# Patient Record
Sex: Female | Born: 1935 | Race: White | Hispanic: No | Smoking: Former smoker
Health system: Southern US, Community
[De-identification: ages and names within clinical notes are randomized; demographics above are authoritative.]

## PROBLEM LIST (undated history)

## (undated) DIAGNOSIS — K219 Gastro-esophageal reflux disease without esophagitis: Secondary | ICD-10-CM

## (undated) DIAGNOSIS — E785 Hyperlipidemia, unspecified: Secondary | ICD-10-CM

## (undated) DIAGNOSIS — T7840XA Allergy, unspecified, initial encounter: Secondary | ICD-10-CM

## (undated) DIAGNOSIS — I454 Nonspecific intraventricular block: Secondary | ICD-10-CM

## (undated) DIAGNOSIS — I10 Essential (primary) hypertension: Secondary | ICD-10-CM

## (undated) DIAGNOSIS — I5022 Chronic systolic (congestive) heart failure: Secondary | ICD-10-CM

## (undated) DIAGNOSIS — I872 Venous insufficiency (chronic) (peripheral): Secondary | ICD-10-CM

## (undated) DIAGNOSIS — E0590 Thyrotoxicosis, unspecified without thyrotoxic crisis or storm: Secondary | ICD-10-CM

## (undated) DIAGNOSIS — K589 Irritable bowel syndrome without diarrhea: Secondary | ICD-10-CM

## (undated) DIAGNOSIS — I272 Pulmonary hypertension, unspecified: Secondary | ICD-10-CM

## (undated) HISTORY — DX: Pulmonary hypertension, unspecified: I27.20

## (undated) HISTORY — DX: Nonspecific intraventricular block: I45.4

## (undated) HISTORY — DX: Chronic systolic (congestive) heart failure: I50.22

## (undated) HISTORY — DX: Irritable bowel syndrome, unspecified: K58.9

## (undated) HISTORY — DX: Gastro-esophageal reflux disease without esophagitis: K21.9

## (undated) HISTORY — DX: Thyrotoxicosis, unspecified without thyrotoxic crisis or storm: E05.90

## (undated) HISTORY — DX: Allergy, unspecified, initial encounter: T78.40XA

## (undated) HISTORY — DX: Hyperlipidemia, unspecified: E78.5

## (undated) HISTORY — DX: Venous insufficiency (chronic) (peripheral): I87.2

## (undated) HISTORY — DX: Essential (primary) hypertension: I10

## (undated) HISTORY — PX: TONSILLECTOMY AND ADENOIDECTOMY: SUR1326

---
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
## 1990-01-05 HISTORY — PX: OTHER SURGICAL HISTORY: SHX169

## 2002-01-05 HISTORY — PX: BACK SURGERY: SHX140

## 2004-02-15 ENCOUNTER — Emergency Department: Payer: Self-pay | Admitting: General Practice

## 2004-05-23 ENCOUNTER — Ambulatory Visit: Payer: Self-pay | Admitting: Ophthalmology

## 2004-07-01 ENCOUNTER — Ambulatory Visit: Payer: Self-pay

## 2004-08-22 ENCOUNTER — Ambulatory Visit: Payer: Self-pay

## 2004-10-08 ENCOUNTER — Ambulatory Visit: Payer: Self-pay | Admitting: Specialist

## 2005-10-06 ENCOUNTER — Ambulatory Visit: Payer: Self-pay | Admitting: Unknown Physician Specialty

## 2006-01-05 HISTORY — PX: ABLATION SAPHENOUS VEIN W/ RFA: SUR11

## 2006-06-07 ENCOUNTER — Ambulatory Visit: Payer: Self-pay | Admitting: Specialist

## 2006-06-23 ENCOUNTER — Ambulatory Visit: Payer: Self-pay | Admitting: Specialist

## 2007-06-28 ENCOUNTER — Encounter: Payer: Self-pay | Admitting: Family Medicine

## 2008-02-23 ENCOUNTER — Encounter: Payer: Self-pay | Admitting: Internal Medicine

## 2008-04-05 ENCOUNTER — Ambulatory Visit: Payer: Self-pay | Admitting: Unknown Physician Specialty

## 2008-04-05 ENCOUNTER — Encounter: Payer: Self-pay | Admitting: Internal Medicine

## 2008-04-19 ENCOUNTER — Encounter: Payer: Self-pay | Admitting: Internal Medicine

## 2008-07-05 ENCOUNTER — Encounter: Payer: Self-pay | Admitting: Internal Medicine

## 2008-08-31 ENCOUNTER — Encounter: Payer: Self-pay | Admitting: Internal Medicine

## 2008-12-07 ENCOUNTER — Encounter: Payer: Self-pay | Admitting: Internal Medicine

## 2009-03-27 ENCOUNTER — Encounter: Payer: Self-pay | Admitting: Internal Medicine

## 2009-04-05 ENCOUNTER — Encounter: Payer: Self-pay | Admitting: Internal Medicine

## 2009-05-08 ENCOUNTER — Emergency Department: Payer: Self-pay | Admitting: Emergency Medicine

## 2009-05-13 ENCOUNTER — Encounter: Payer: Self-pay | Admitting: Internal Medicine

## 2009-05-15 ENCOUNTER — Ambulatory Visit: Payer: Self-pay | Admitting: Unknown Physician Specialty

## 2009-05-15 ENCOUNTER — Encounter: Payer: Self-pay | Admitting: Internal Medicine

## 2009-05-21 ENCOUNTER — Encounter: Payer: Self-pay | Admitting: Internal Medicine

## 2009-05-24 ENCOUNTER — Ambulatory Visit: Payer: Self-pay | Admitting: Family Medicine

## 2009-06-21 ENCOUNTER — Ambulatory Visit: Payer: Self-pay | Admitting: Internal Medicine

## 2009-06-21 DIAGNOSIS — I10 Essential (primary) hypertension: Secondary | ICD-10-CM

## 2009-06-21 DIAGNOSIS — I872 Venous insufficiency (chronic) (peripheral): Secondary | ICD-10-CM | POA: Insufficient documentation

## 2009-06-21 DIAGNOSIS — K219 Gastro-esophageal reflux disease without esophagitis: Secondary | ICD-10-CM

## 2009-06-21 DIAGNOSIS — M810 Age-related osteoporosis without current pathological fracture: Secondary | ICD-10-CM | POA: Insufficient documentation

## 2009-06-21 DIAGNOSIS — E039 Hypothyroidism, unspecified: Secondary | ICD-10-CM | POA: Insufficient documentation

## 2009-06-21 DIAGNOSIS — K5909 Other constipation: Secondary | ICD-10-CM

## 2009-06-21 DIAGNOSIS — E785 Hyperlipidemia, unspecified: Secondary | ICD-10-CM

## 2009-06-21 DIAGNOSIS — M1990 Unspecified osteoarthritis, unspecified site: Secondary | ICD-10-CM | POA: Insufficient documentation

## 2009-06-21 DIAGNOSIS — J309 Allergic rhinitis, unspecified: Secondary | ICD-10-CM | POA: Insufficient documentation

## 2009-07-05 ENCOUNTER — Ambulatory Visit: Payer: Self-pay | Admitting: Family Medicine

## 2009-07-05 DIAGNOSIS — B351 Tinea unguium: Secondary | ICD-10-CM

## 2009-07-24 ENCOUNTER — Telehealth (INDEPENDENT_AMBULATORY_CARE_PROVIDER_SITE_OTHER): Payer: Self-pay | Admitting: *Deleted

## 2009-07-25 ENCOUNTER — Ambulatory Visit: Payer: Self-pay | Admitting: Internal Medicine

## 2009-07-25 DIAGNOSIS — R32 Unspecified urinary incontinence: Secondary | ICD-10-CM

## 2009-07-25 LAB — CONVERTED CEMR LAB
Bilirubin Urine: NEGATIVE
Blood in Urine, dipstick: NEGATIVE
Glucose, Urine, Semiquant: NEGATIVE
Ketones, urine, test strip: NEGATIVE
Nitrite: NEGATIVE
Protein, U semiquant: NEGATIVE
Specific Gravity, Urine: 1.01
Urobilinogen, UA: 0.2
pH: 7.5

## 2009-07-26 ENCOUNTER — Telehealth: Payer: Self-pay | Admitting: Internal Medicine

## 2009-07-31 ENCOUNTER — Encounter: Payer: Self-pay | Admitting: Internal Medicine

## 2009-08-02 ENCOUNTER — Telehealth: Payer: Self-pay | Admitting: Internal Medicine

## 2009-08-27 ENCOUNTER — Emergency Department: Payer: Self-pay | Admitting: Emergency Medicine

## 2009-09-02 ENCOUNTER — Ambulatory Visit: Payer: Self-pay | Admitting: Internal Medicine

## 2009-09-02 DIAGNOSIS — S60919A Unspecified superficial injury of unspecified wrist, initial encounter: Secondary | ICD-10-CM

## 2009-09-02 DIAGNOSIS — S50909A Unspecified superficial injury of unspecified elbow, initial encounter: Secondary | ICD-10-CM | POA: Insufficient documentation

## 2009-09-02 DIAGNOSIS — S50919A Unspecified superficial injury of unspecified forearm, initial encounter: Secondary | ICD-10-CM

## 2009-12-09 ENCOUNTER — Ambulatory Visit: Payer: Self-pay | Admitting: Internal Medicine

## 2009-12-10 ENCOUNTER — Telehealth: Payer: Self-pay | Admitting: Internal Medicine

## 2009-12-10 LAB — CONVERTED CEMR LAB
ALT: 27 units/L (ref 0–35)
AST: 45 units/L — ABNORMAL HIGH (ref 0–37)
Albumin: 3.7 g/dL (ref 3.5–5.2)
Alkaline Phosphatase: 59 units/L (ref 39–117)
BUN: 26 mg/dL — ABNORMAL HIGH (ref 6–23)
Basophils Absolute: 0 10*3/uL (ref 0.0–0.1)
Basophils Relative: 0.1 % (ref 0.0–3.0)
Bilirubin, Direct: 0.1 mg/dL (ref 0.0–0.3)
CO2: 33 meq/L — ABNORMAL HIGH (ref 19–32)
Calcium: 9.3 mg/dL (ref 8.4–10.5)
Chloride: 94 meq/L — ABNORMAL LOW (ref 96–112)
Creatinine, Ser: 0.8 mg/dL (ref 0.4–1.2)
Eosinophils Absolute: 0 10*3/uL (ref 0.0–0.7)
Eosinophils Relative: 0.5 % (ref 0.0–5.0)
Free T4: 1.37 ng/dL (ref 0.60–1.60)
GFR calc non Af Amer: 74.42 mL/min (ref >60.00)
Glucose, Bld: 82 mg/dL (ref 70–99)
HCT: 42.4 % (ref 36.0–46.0)
Hemoglobin: 14.7 g/dL (ref 12.0–15.0)
Lymphocytes Relative: 16.5 % (ref 12.0–46.0)
Lymphs Abs: 1 10*3/uL (ref 0.7–4.0)
MCHC: 34.6 g/dL (ref 30.0–36.0)
MCV: 91.1 fL (ref 78.0–100.0)
Monocytes Absolute: 0.5 10*3/uL (ref 0.1–1.0)
Monocytes Relative: 7.3 % (ref 3.0–12.0)
Neutro Abs: 4.6 10*3/uL (ref 1.4–7.7)
Neutrophils Relative %: 75.6 % (ref 43.0–77.0)
Phosphorus: 4 mg/dL (ref 2.3–4.6)
Platelets: 140 10*3/uL — ABNORMAL LOW (ref 150.0–400.0)
Potassium: 3.2 meq/L — ABNORMAL LOW (ref 3.5–5.1)
RBC: 4.65 M/uL (ref 3.87–5.11)
RDW: 13.2 % (ref 11.5–14.6)
Sodium: 136 meq/L (ref 135–145)
TSH: 1.78 microintl units/mL (ref 0.35–5.50)
Total Bilirubin: 0.8 mg/dL (ref 0.3–1.2)
Total Protein: 6.3 g/dL (ref 6.0–8.3)
WBC: 6.2 10*3/uL (ref 4.5–10.5)

## 2009-12-20 ENCOUNTER — Ambulatory Visit: Payer: Self-pay | Admitting: Internal Medicine

## 2009-12-23 LAB — CONVERTED CEMR LAB: Potassium: 3.6 meq/L (ref 3.5–5.1)

## 2010-01-21 ENCOUNTER — Telehealth: Payer: Self-pay | Admitting: Internal Medicine

## 2010-01-21 ENCOUNTER — Ambulatory Visit
Admission: RE | Admit: 2010-01-21 | Discharge: 2010-01-21 | Payer: Self-pay | Source: Home / Self Care | Attending: Internal Medicine | Admitting: Internal Medicine

## 2010-01-21 ENCOUNTER — Encounter: Payer: Self-pay | Admitting: Internal Medicine

## 2010-01-21 ENCOUNTER — Other Ambulatory Visit: Payer: Self-pay | Admitting: Internal Medicine

## 2010-01-21 DIAGNOSIS — G459 Transient cerebral ischemic attack, unspecified: Secondary | ICD-10-CM | POA: Insufficient documentation

## 2010-01-21 LAB — CBC WITH DIFFERENTIAL/PLATELET
Basophils Absolute: 0 10*3/uL (ref 0.0–0.1)
Basophils Relative: 0 % (ref 0.0–3.0)
Eosinophils Absolute: 0 10*3/uL (ref 0.0–0.7)
Eosinophils Relative: 0.3 % (ref 0.0–5.0)
HCT: 37.5 % (ref 36.0–46.0)
Hemoglobin: 12.9 g/dL (ref 12.0–15.0)
Lymphocytes Relative: 7.4 % — ABNORMAL LOW (ref 12.0–46.0)
Lymphs Abs: 0.6 10*3/uL — ABNORMAL LOW (ref 0.7–4.0)
MCHC: 34.3 g/dL (ref 30.0–36.0)
MCV: 90.6 fl (ref 78.0–100.0)
Monocytes Absolute: 0.5 10*3/uL (ref 0.1–1.0)
Monocytes Relative: 5.9 % (ref 3.0–12.0)
Neutro Abs: 6.9 10*3/uL (ref 1.4–7.7)
Neutrophils Relative %: 86.4 % — ABNORMAL HIGH (ref 43.0–77.0)
Platelets: 196 10*3/uL (ref 150.0–400.0)
RBC: 4.14 Mil/uL (ref 3.87–5.11)
RDW: 13.1 % (ref 11.5–14.6)
WBC: 8 10*3/uL (ref 4.5–10.5)

## 2010-01-21 LAB — RENAL FUNCTION PANEL
Albumin: 3.2 g/dL — ABNORMAL LOW (ref 3.5–5.2)
BUN: 24 mg/dL — ABNORMAL HIGH (ref 6–23)
CO2: 30 mEq/L (ref 19–32)
Calcium: 8.8 mg/dL (ref 8.4–10.5)
Chloride: 98 mEq/L (ref 96–112)
Creatinine, Ser: 0.5 mg/dL (ref 0.4–1.2)
GFR: 112.04 mL/min (ref >60.00)
Glucose, Bld: 97 mg/dL (ref 70–99)
Phosphorus: 3.9 mg/dL (ref 2.3–4.6)
Potassium: 3.9 mEq/L (ref 3.5–5.1)
Sodium: 137 mEq/L (ref 135–145)

## 2010-01-21 LAB — HEPATIC FUNCTION PANEL
ALT: 20 U/L (ref 0–35)
AST: 33 U/L (ref 0–37)
Albumin: 3.2 g/dL — ABNORMAL LOW (ref 3.5–5.2)
Alkaline Phosphatase: 55 U/L (ref 39–117)
Bilirubin, Direct: 0.1 mg/dL (ref 0.0–0.3)
Total Bilirubin: 0.7 mg/dL (ref 0.3–1.2)
Total Protein: 6.2 g/dL (ref 6.0–8.3)

## 2010-01-21 LAB — TSH: TSH: 1.31 u[IU]/mL (ref 0.35–5.50)

## 2010-02-04 NOTE — Progress Notes (Signed)
Summary: desipramine  Phone Note Call from Patient Call back at Home Phone [phone]   Caller: Patient Call For: Cindee Salt MD Summary of Call: Patient was taking OXYBUTYNIN CHLORIDE 5 MG TABS and it was causing her to have blurred vision.  She then started on DESIPRAMINE HCL 10 MG TABS and she says that is also causing her to have blurred vision. She wants to know what she should do now. Initial call taken by: Melody Comas,  August 02, 2009 4:45 PM  Follow-up for Phone Call        I am unaware of any overactive bladder medication that does not cause blurred vision as a possible side effect.  PLease let her know that Dr. Alphonsus Sias will return in one week and I will send this to him for his input.   Ruthe Mannan MD  August 04, 2009 2:16 PM  Please let her know that she will not be able to take medicine for her bladder Follow-up by: Cindee Salt MD,  August 04, 2009 4:07 PM  Additional Follow-up for Phone Call Additional follow up Details #1::        Spoke with patient and notified her that no meds were available for her at this point that didn't have that as a side effect. She was fine with that and said she would rather deal with the bladder issue than the blurred vision. Additional Follow-up by: [name] CMA,  August 05, 2009 9:06 AM

## 2010-02-04 NOTE — Assessment & Plan Note (Signed)
Summary: 3 month follow up/rbh   Vital Signs:  Patient profile:   77 year old female Weight:      129 pounds Temp:     98.3 degrees F oral BP sitting:   148 / 80  (left arm) Cuff size:   regular  Vitals Entered By: Mervin Hack CMA Duncan Dull) (September 02, 2009 12:29 PM) CC: 3 month follow-up   History of Present Illness: Unable to tolerate oxybutynin or desipramine Affected her vision Still using depends---but has to use 2-3 each night no problems during the day  Has skin tear on left forearm Stacey Blankenship has been treating this  Legs still have some swelling No sig pain lately Tries to keep them elevated and uses support stockings  No chest pain No SOB  Allergies: No Known Drug Allergies  Past History:  Past medical, surgical, family and social histories (including risk factors) reviewed for relevance to current acute and chronic problems.  Past Medical History: Reviewed history from 06/21/2009 and no changes required. Allergic rhinitis GERD Hyperlipidemia Hypertension Hypothyroidism Osteoporosis IBS/chronic constipation Right bundle branch block Chronic venous insufficiency Osteoarthritis  Past Surgical History: Reviewed history from 06/21/2009 and no changes required. Tonsillectomy and adenoidectomy 1920 Pneumonia  1992 Back surgery 2004 Vein ablation  ~2008  Family History: Reviewed history from 06/21/2009 and no changes required. Mom died of old age at 37 Dad died @67  of cerebral thrombosis 1 brother died of multiorgan failure 1 brother living  Social History: Reviewed history from 06/21/2009 and no changes required. 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 Daughter [name] is her health care POA. Has DNR and reviewed this order. Would not want tube feeds if cogitively unaware.  Review of Systems       sleeps  fine despite the urinary issues--just has to change attends appetite is good weight is stable  Physical Exam  General:  alert and normal appearance.   Neck:  supple, no masses, no thyromegaly, no carotid bruits, and no cervical lymphadenopathy.   Lungs:  normal respiratory effort, no intercostal retractions, no accessory muscle use, and normal breath sounds.   Heart:  normal rate, regular rhythm, no murmur, and no gallop.   Abdomen:  soft and non-tender.   Extremities:  no sig edema with hose on Skin:  skin tear with devitalized skin on left forearm no infection Psych:  normally interactive, good eye contact, not anxious appearing, and not depressed appearing.     Impression & Recommendations:  Problem # 1:  INCONTINENCE/ENURESIS, NOS (ICD-788.30) Assessment Unchanged unable to take meds will continue with the depends  Problem # 2:  HYPERTENSION (ICD-401.9) Assessment: Unchanged okay with the meds  Her updated medication list for this problem includes:    Metoprolol Tartrate 50 Mg Tabs (Metoprolol tartrate) .Marland Kitchen... Take 1 by mouth once daily    Losartan Potassium-hctz 100-25 Mg Tabs (Losartan potassium-hctz) .Marland Kitchen... Take 1 by mouth once daily  BP today: 148/80 Prior BP: 126/74 (07/25/2009)  Problem # 3:  OTH&UNS SUP INJR ELB FORARM&WRST W/O MENTION INF (ICD-913.8) Assessment: New  devitalized skin debrided and covered with silvadene and sterile dressing  Orders: Ace Wraps 3-5 in/yard  ([id number]) EMR Misc Charge Code [facility])  Problem # 4:  HYPOTHYROIDISM (ICD-244.9) Assessment: Unchanged okay on meds will do labs next time  Her updated medication list for this problem includes:    Levoxyl 75 Mcg Tabs (Levothyroxine sodium) .Marland KitchenMarland KitchenMarland KitchenMarland Kitchen  Take 1 by mouth once daily  Complete Medication List: 1)  Metoprolol Tartrate 50 Mg Tabs (Metoprolol tartrate) .... Take 1 by mouth once daily 2)  Levoxyl 75 Mcg Tabs (Levothyroxine sodium) .... Take 1 by mouth once daily 3)  Losartan  Potassium-hctz 100-25 Mg Tabs (Losartan potassium-hctz) .... Take 1 by mouth once daily 4)  Miralax Powd (Polyethylene glycol 3350) .Marland Kitchen.. 1 capful daily with water to prevent constipation as needed 5)  Aspirin 81 Mg Tbec (Aspirin) .Marland Kitchen.. 1 tab every other day 6)  Thera-m Tabs (Multiple vitamins-minerals) .... Take 1 by mouth once daily 7)  Preservision/lutein Caps (Multiple vitamins-minerals) .... Take 1 by mouth once daily 8)  Vitamin D 1000 Unit Tabs (Cholecalciferol) .... Take 1 by mouth once daily 9)  Calcium-vitamin D3 600-200 Mg-unit Tabs (Calcium carbonate-vitamin d) .... Take 1 by mouth once daily 10)  Omega-3 350 Mg Caps (Omega-3 fatty acids) .... Take 1 by mouth once daily 11)  Vitamin B-12 1000 Mcg Tabs (Cyanocobalamin) .... Take 1 by mouth once daily 12)  Lutein 20 Mg Tabs (Lutein) .... Take 1 by mouth once daily 13)  B Complex 100 Tr Cr-tabs (B complex vitamins) .... Take 1 by mouth once daily 14)  Silver Sulfadiazine 1 % Crea (Silver sulfadiazine) .... Apply to skin tear daily till well scabbed  Patient Instructions: 1)  Please schedule a follow-up appointment in 4 months .  Prescriptions: SILVER SULFADIAZINE 1 % CREA (SILVER SULFADIAZINE) apply to skin tear daily till well scabbed  #30gm x 1   Entered and Authorized by:   Stacey Blankenship   Signed by:   Stacey Blankenship on 09/02/2009   Method used:   Electronically to        [facility]* (retail)       [address]       [place], [place]  [address]       Ph: [phone]       Fax: [phone]   RxID:   [id number]   Current Allergies (reviewed today): No known allergies

## 2010-02-04 NOTE — Letter (Signed)
Summary: [facility]  [facility]   Imported By: Lanelle Bal 07/12/2009 13:03:48  _____________________________________________________________________  External Attachment:    Type:   Image     Comment:   External Document

## 2010-02-04 NOTE — Assessment & Plan Note (Signed)
Summary: NEW PT TO [facility]/[facility]   Vital Signs:  Patient profile:   77 year old female Height:      62 inches Weight:      127 pounds BMI:     23.31 Temp:     97.6 degrees F oral Pulse rate:   64 / minute Pulse rhythm:   regular BP sitting:   112 / 72  (left arm) Cuff size:   regular  Vitals Entered By: Mervin Hack CMA Duncan Dull) (June 21, 2009 3:13 PM) CC: new patient to establish care   History of Present Illness: [place] resident Establishing with me  History of recurrent abdominal bloating Constipation which she feels is controlled with apples, etc still bloated though further eval sounds like constipation--will have her restart the miralax  had spell of confusion with resp infection in ER Work up negative but called TIA and started on aggrenox doesn't really sound like true neurologic event though  HTN for many years has been variable  allergies--okay on medicines  Some arthritic pain does okay with glucosamine and chondriotin  Chronic venous insuff not satisfied with vein procedure just wears support hose   Preventive Screening-Counseling & Management  Alcohol-Tobacco     Smoking Status: quit  Allergies (verified): No Known Drug Allergies  Past History:  Past Medical History: Allergic rhinitis GERD Hyperlipidemia Hypertension Hypothyroidism Osteoporosis IBS/chronic constipation Right bundle branch block Chronic venous insufficiency Osteoarthritis  Past Surgical History: Tonsillectomy and adenoidectomy 1920 Pneumonia  1992 Back surgery 06-16-2002 Vein ablation  06/16/2006  Family History: Mom died of old age at 21 Dad died @67  of cerebral thrombosis 1 brother died of multiorgan failure 1 brother living  Social History: Retired--primary Engineer, site, 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 Daughter [name] is her health care  POA. Has DNR and reviewed this order. Would not want tube feeds if cogitively unaware.Smoking Status:  quit  Review of Systems General:  Denies sleep disorder; weight down slightly wears seat belt. Eyes:  Denies double vision and vision loss-1 eye. ENT:  Complains of decreased hearing; wears hearing aides own teeth --regular with dentist. CV:  Denies chest pain or discomfort, difficulty breathing at night, difficulty breathing while lying down, fainting, lightheadness, palpitations, and shortness of breath with exertion. Resp:  Denies cough and shortness of breath. GI:  Complains of constipation and gas; denies bloody stools, dark tarry stools, and indigestion. GU:  Complains of incontinence and urinary frequency; denies dysuria; urge incontinence--wears pad. MS:  Complains of joint pain; denies joint swelling; does well with osteo biflex. Derm:  Denies lesion(s) and rash. Neuro:  Complains of tingling; denies numbness and weakness; occ hand tingling. Psych:  Denies anxiety and depression. Allergy:  Complains of seasonal allergies and sneezing.  Physical Exam  General:  alert and normal appearance.   Eyes:  pupils equal, pupils round, and pupils reactive to light.   Mouth:  no erythema and no exudates.   Neck:  supple, no masses, no thyromegaly, no carotid bruits, and no cervical lymphadenopathy.   Lungs:  normal respiratory effort and normal breath sounds.   Heart:  normal rate, regular rhythm, no murmur, and no gallop.   Abdomen:  soft and non-tender.   Msk:  no joint tenderness and no joint swelling.   Pulses:  faint in feet Extremities:  no sig edema Skin:  no rashes and no suspicious lesions.   Mild venous  stasis changes Axillary Nodes:  No palpable lymphadenopathy Psych:  normally interactive, good eye contact, not anxious appearing, and not depressed appearing.     Impression & Recommendations:  Problem # 1:  HYPERTENSION (ICD-401.9) Assessment Unchanged good  control changes may be able to drop a med next time doesn't sound like TIA----will change aggrenox back to asa  Her updated medication list for this problem includes:    Metoprolol Tartrate 50 Mg Tabs (Metoprolol tartrate) .Marland Kitchen... Take 1 by mouth once daily    Losartan Potassium-hctz 100-25 Mg Tabs (Losartan potassium-hctz) .Marland Kitchen... Take 1 by mouth once daily  BP today: 112/72  Problem # 2:  OTHER CONSTIPATION (ICD-564.09) Assessment: Comment Only asked her to restart regular miralax  The following medications were removed from the medication list:    Stool Softener 100 Mg Caps (Docusate sodium) .Marland Kitchen... Take 1 by mouth once daily Her updated medication list for this problem includes:    Miralax Powd (Polyethylene glycol 3350) .Marland Kitchen... 1 capful daily with water to prevent constipation  Problem # 3:  HYPOTHYROIDISM (ICD-244.9) Assessment: Comment Only clinically euthyroid labs next time reviewed labs from Dr Lin Givens  Her updated medication list for this problem includes:    Levoxyl 75 Mcg Tabs (Levothyroxine sodium) .Marland Kitchen... Take 1 by mouth once daily  Problem # 4:  VENOUS INSUFFICIENCY (ICD-459.81) Assessment: Unchanged mild okay with stockings now  Problem # 5:  OSTEOPOROSIS (ICD-733.00) Assessment: Unchanged on calcium and vitamin D  Her updated medication list for this problem includes:    Vitamin D 1000 Unit Tabs (Cholecalciferol) .Marland Kitchen... Take 1 by mouth once daily    Calcium-vitamin D3 600-200 Mg-unit Tabs (Calcium carbonate-vitamin d) .Marland Kitchen... Take 1 by mouth once daily  Problem # 6:  OSTEOARTHRITIS (ICD-715.90) Assessment: Comment Only okay with just osteo bi flex  Complete Medication List: 1)  Metoprolol Tartrate 50 Mg Tabs (Metoprolol tartrate) .... Take 1 by mouth once daily 2)  Levoxyl 75 Mcg Tabs (Levothyroxine sodium) .... Take 1 by mouth once daily 3)  Losartan Potassium-hctz 100-25 Mg Tabs (Losartan potassium-hctz) .... Take 1 by mouth once daily 4)  Miralax Powd  (Polyethylene glycol 3350) .Marland Kitchen.. 1 capful daily with water to prevent constipation 5)  Aspirin 81 Mg Tbec (Aspirin) .Marland Kitchen.. 1 tab daily 6)  Thera-m Tabs (Multiple vitamins-minerals) .... Take 1 by mouth once daily 7)  Preservision/lutein Caps (Multiple vitamins-minerals) .... Take 1 by mouth once daily 8)  Vitamin D 1000 Unit Tabs (Cholecalciferol) .... Take 1 by mouth once daily 9)  Calcium-vitamin D3 600-200 Mg-unit Tabs (Calcium carbonate-vitamin d) .... Take 1 by mouth once daily 10)  Omega-3 350 Mg Caps (Omega-3 fatty acids) .... Take 1 by mouth once daily 11)  Osteo Bi-flex Regular Strength 250-200 Mg Tabs (Glucosamine-chondroitin) .... Take 1 by mouth once daily 12)  Vitamin B-12 1000 Mcg Tabs (Cyanocobalamin) .... Take 1 by mouth once daily 13)  Lutein 20 Mg Tabs (Lutein) .... Take 1 by mouth once daily 14)  B Complex 100 Tr Cr-tabs (B complex vitamins) .... Take 1 by mouth once daily  Patient Instructions: 1)  Please schedule a follow-up appointment in 3 months .   Current Allergies (reviewed today): No known allergies    Immunization History:  Tetanus/Td Immunization History:    Tetanus/Td:  Historical (08/06/2008)  Pneumovax Immunization History:    Pneumovax:  Historical (10/09/2002)

## 2010-02-04 NOTE — Assessment & Plan Note (Signed)
Summary: TROUBLE URINATING/[facility]   Vital Signs:  Patient profile:   77 year old female Weight:      129.13 pounds Temp:     98.3 degrees F oral Pulse rate:   76 / minute Pulse rhythm:   regular BP sitting:   126 / 74  (right arm) Cuff size:   regular  Vitals Entered By: [name] CMA (July 25, 2009 4:00 PM) CC: frequent/trouble urination   History of Present Illness: having a number of concerns  Has been voiding a lot trouble with control---has to use pads Esp bothersome at night--has cover over bed but it gets soaked  Hurts if she tries to hold urine No sig dysuria Pad during day is reasonably successful  No hematuria  Did use med from urologist but had terrible dry eyes from it  Allergies: No Known Drug Allergies  Past History:  Past medical, surgical, family and social histories (including risk factors) reviewed for relevance to current acute and chronic problems.  Past Medical History: Reviewed history from 06/21/2009 and no changes required. Allergic rhinitis GERD Hyperlipidemia Hypertension Hypothyroidism Osteoporosis IBS/chronic constipation Right bundle branch block Chronic venous insufficiency Osteoarthritis  Past Surgical History: Reviewed history from 06/21/2009 and no changes required. Tonsillectomy and adenoidectomy 1920 Pneumonia  1992 Back surgery 2004 Vein ablation  ~2008  Family History: Reviewed history from 06/21/2009 and no changes required. Mom died of old age at 52 Dad died @67  of cerebral thrombosis 1 brother died of multiorgan failure 1 brother living  Social History: Reviewed history from 06/21/2009 and no changes required. 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 Daughter [name] is her health care POA. Has DNR and reviewed this order. Would not want tube feeds if cogitively  unaware.  Review of Systems       no fever no nausea or vomiting  Physical Exam  General:  alert and normal appearance.   Abdomen:  soft and non-tender.   SLight distention   Impression & Recommendations:  Problem # 1:  INCONTINENCE/ENURESIS, NOS (ICD-788.30) Assessment Deteriorated  really troubling her at night no signs of infection  will try low dose oxybutynin if side effects, or doesn't work, can dry desipramine   Orders: UA Dipstick w/o Micro (manual) ([address])  Complete Medication List: 1)  Metoprolol Tartrate 50 Mg Tabs (Metoprolol tartrate) .... Take 1 by mouth once daily 2)  Levoxyl 75 Mcg Tabs (Levothyroxine sodium) .... Take 1 by mouth once daily 3)  Losartan Potassium-hctz 100-25 Mg Tabs (Losartan potassium-hctz) .... Take 1 by mouth once daily 4)  Miralax Powd (Polyethylene glycol 3350) .Marland Kitchen.. 1 capful daily with water to prevent constipation 5)  Aspirin 81 Mg Tbec (Aspirin) .Marland Kitchen.. 1 tab daily 6)  Thera-m Tabs (Multiple vitamins-minerals) .... Take 1 by mouth once daily 7)  Preservision/lutein Caps (Multiple vitamins-minerals) .... Take 1 by mouth once daily 8)  Vitamin D 1000 Unit Tabs (Cholecalciferol) .... Take 1 by mouth once daily 9)  Calcium-vitamin D3 600-200 Mg-unit Tabs (Calcium carbonate-vitamin d) .... Take 1 by mouth once daily 10)  Omega-3 350 Mg Caps (Omega-3 fatty acids) .... Take 1 by mouth once daily 11)  Osteo Bi-flex Regular Strength 250-200 Mg Tabs (Glucosamine-chondroitin) .... Take 1 by mouth once daily 12)  Vitamin B-12 1000 Mcg Tabs (Cyanocobalamin) .... Take 1 by mouth once daily 13)  Lutein 20 Mg Tabs (Lutein) .... Take 1 by mouth once  daily 14)  B Complex 100 Tr Cr-tabs (B complex vitamins) .... Take 1 by mouth once daily 15)  Oxybutynin Chloride 5 Mg Tabs (Oxybutynin chloride) .... 1/2-1 tab by mouth at bedtime to help urinary symptoms  Patient Instructions: 1)  Please keep September 28th appt 2)  Please call if the oxybutynin doesn't  work or you have to stop it due to side effects Prescriptions: OXYBUTYNIN CHLORIDE 5 MG TABS (OXYBUTYNIN CHLORIDE) 1/2-1 tab by mouth at bedtime to help urinary symptoms  #30 x 3   Entered and Authorized by:   Stacey Blankenship   Signed by:   Stacey Blankenship on 07/25/2009   Method used:   Electronically to        [facility]* (retail)       [address]       [place], [place]  [address]       Ph: [phone]       Fax: [phone]   RxID:   [id number]   Current Allergies (reviewed today): No known allergies  Laboratory Results   Urine Tests  Date/Time Received: July 25, 2009 4:57 PM  Date/Time Reported: July 25, 2009 4:57 PM   Routine Urinalysis   Color: yellow Appearance: Clear Glucose: negative   (Normal Range: Negative) Bilirubin: negative   (Normal Range: Negative) Ketone: negative   (Normal Range: Negative) Spec. Gravity: 1.010   (Normal Range: 1.003-1.035) Blood: negative   (Normal Range: Negative) pH: 7.5   (Normal Range: 5.0-8.0) Protein: negative   (Normal Range: Negative) Urobilinogen: 0.2   (Normal Range: 0-1) Nitrite: negative   (Normal Range: Negative) Leukocyte Esterace: trace   (Normal Range: Negative)

## 2010-02-04 NOTE — Letter (Signed)
Summary: Office Notes Dated 02-23-08 thru 05-30-09/[facility]  Office Notes Dated 02-23-08 thru 05-30-09/[facility]   Imported By: Lanelle Bal 06/26/2009 09:36:39  _____________________________________________________________________  External Attachment:    Type:   Image     Comment:   External Document

## 2010-02-04 NOTE — Miscellaneous (Signed)
Summary: Do Not Resuscitate Order  Do Not Resuscitate Order   Imported By: Beau Fanny 06/21/2009 16:45:48  _____________________________________________________________________  External Attachment:    Type:   Image     Comment:   External Document

## 2010-02-04 NOTE — Letter (Signed)
Summary: Patient Questionnaire  Patient Questionnaire   Imported By: Beau Fanny 06/24/2009 09:25:44  _____________________________________________________________________  External Attachment:    Type:   Image     Comment:   External Document

## 2010-02-04 NOTE — Progress Notes (Signed)
Summary: oxybutynin   Phone Note Call from Patient Call back at Home Phone [phone]   Caller: Patient Call For: Cindee Salt MD Summary of Call: Patient says that she called her pharmacist today and they said that she tried  the OXYBUTYNIN CHLORIDE 5 MG TABS back in november and it caused her to have blurred vision. She is asking if she could try something different. Uses [facility].  Initial call taken by: Melody Comas,  July 26, 2009 1:36 PM  Follow-up for Phone Call        will try the desipramine instead Let her know new Rx sent  Spoke with patient and advised her of the new medication. [name] CMA  July 26, 2009 2:33 PM  Follow-up by: Cindee Salt MD,  July 26, 2009 1:51 PM    New/Updated Medications: DESIPRAMINE HCL 10 MG TABS (DESIPRAMINE HCL) take 1-2 tabs at bedtime to reduce urination Prescriptions: DESIPRAMINE HCL 10 MG TABS (DESIPRAMINE HCL) take 1-2 tabs at bedtime to reduce urination  #60 x 1   Entered and Authorized by:   Cindee Salt MD   Signed by:   Cindee Salt MD on 07/26/2009   Method used:   Electronically to        [facility]* (retail)       [address]       [place], [place]  [address]       Ph: [phone]       Fax: [phone]   RxID:   [id number]

## 2010-02-04 NOTE — Letter (Signed)
Summary: [facility]  [facility]   Imported By: Lanelle Bal 08/09/2009 13:41:58  _____________________________________________________________________  External Attachment:    Type:   Image     Comment:   External Document  Appended Document: [facility] Please call patient Let her know the circulation in the legs is normal Very reassuring  please give [facility] my correct address  Appended Document: [facility] Patient notified and address updated with [facility].  Appended Document: [facility] She is still complaining of leg/foot pain and redness/discoloration to legs and feet. She requests "that you think about what could be causing this" and let her know at her next appointment.  Appended Document: [facility] Noted

## 2010-02-04 NOTE — Assessment & Plan Note (Signed)
Summary: PROBLEM WITH FOOT/lb   Vital Signs:  Patient profile:   77 year old female Height:      62 inches Weight:      129 pounds BMI:     23.68 Temp:     98.6 degrees F oral Pulse rate:   68 / minute Pulse rhythm:   regular BP sitting:   110 / 88  (right arm) Cuff size:   regular  Vitals Entered By: Linde Gillis CMA Duncan Dull) (July 05, 2009 2:12 PM) CC: problem with foot   History of Present Illness: 77 yo female new to me here to discuss discoloration of her right foot.  Has a history of venous insufficiency and chronic discoloration bilaterally from venous insufficiency.  She wears support hose.  Over past few days, she has noticed that the top of her right foot and toes are turning dark purple and flaky.  Does not hurt or itch.  Her nails are also very thick and itchy. Wants to know what she can do about that.    Current Medications (verified): 1)  Metoprolol Tartrate 50 Mg Tabs (Metoprolol Tartrate) .... Take 1 By Mouth Once Daily 2)  Levoxyl 75 Mcg Tabs (Levothyroxine Sodium) .... Take 1 By Mouth Once Daily 3)  Losartan Potassium-Hctz 100-25 Mg Tabs (Losartan Potassium-Hctz) .... Take 1 By Mouth Once Daily 4)  Miralax  Powd (Polyethylene Glycol 3350) .Marland Kitchen.. 1 Capful Daily With Water To Prevent Constipation 5)  Aspirin 81 Mg Tbec (Aspirin) .Marland Kitchen.. 1 Tab Daily 6)  Thera-M  Tabs (Multiple Vitamins-Minerals) .... Take 1 By Mouth Once Daily 7)  Preservision/lutein  Caps (Multiple Vitamins-Minerals) .... Take 1 By Mouth Once Daily 8)  Vitamin D 1000 Unit Tabs (Cholecalciferol) .... Take 1 By Mouth Once Daily 9)  Calcium-Vitamin D3 600-200 Mg-Unit Tabs (Calcium Carbonate-Vitamin D) .... Take 1 By Mouth Once Daily 10)  Omega-3 350 Mg Caps (Omega-3 Fatty Acids) .... Take 1 By Mouth Once Daily 11)  Osteo Bi-Flex Regular Strength 250-200 Mg Tabs (Glucosamine-Chondroitin) .... Take 1 By Mouth Once Daily 12)  Vitamin B-12 1000 Mcg Tabs (Cyanocobalamin) .... Take 1 By Mouth Once  Daily 13)  Lutein 20 Mg Tabs (Lutein) .... Take 1 By Mouth Once Daily 14)  B Complex 100 Tr  Cr-Tabs (B Complex Vitamins) .... Take 1 By Mouth Once Daily  Allergies (verified): No Known Drug Allergies  Past History:  Past Medical History: Last updated: 2009/07/21 Allergic rhinitis GERD Hyperlipidemia Hypertension Hypothyroidism Osteoporosis IBS/chronic constipation Right bundle branch block Chronic venous insufficiency Osteoarthritis  Past Surgical History: Last updated: July 21, 2009 Tonsillectomy and adenoidectomy 1920 Pneumonia  1992 Back surgery 2004 Vein ablation  ~2008  Family History: Last updated: July 21, 2009 Mom died of old age at 44 Dad died @67  of cerebral thrombosis 1 brother died of multiorgan failure 1 brother living  Social History: Last updated: July 21, 2009 Retired--primary school Runner, broadcasting/film/video, 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 Daughter [name] is her health care POA. Has DNR and reviewed this order. Would not want tube feeds if cogitively unaware.  Risk Factors: Smoking Status: quit (07-21-2009)  Review of Systems      See HPI CV:  Denies chest pain or discomfort. Resp:  Denies shortness of breath.  Physical Exam  General:  alert and normal appearance.   Msk:  no joint tenderness and no joint swelling.   Extremities:  no sig edema bilateral discoloration in both lower  legs and feet from chronic venous insufficiency. Top of right foot and toes is darker in color.   Pulses are diminished bilaterally, right> left. Nails are thicked and flaky. Psych:  normally interactive, good eye contact, not anxious appearing, and not depressed appearing.     Impression & Recommendations:  Problem # 1:  VENOUS INSUFFICIENCY (ICD-459.81) Assessment Deteriorated Given acute change in right foot, will send for ABIs and doppler of right lower  extremity. Pt is very active and I do not want her to loose mobility. 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 Oral treatment is usually necessary but she would like to try topical lamisil first as there are side effects associated with oral antifugals. Will continue to follow at her next appt.  Complete Medication List: 1)  Metoprolol Tartrate 50 Mg Tabs (Metoprolol tartrate) .... Take 1 by mouth once daily 2)  Levoxyl 75 Mcg Tabs (Levothyroxine sodium) .... Take 1 by mouth once daily 3)  Losartan Potassium-hctz 100-25 Mg Tabs (Losartan potassium-hctz) .... Take 1 by mouth once daily 4)  Miralax Powd (Polyethylene glycol 3350) .Marland Kitchen.. 1 capful daily with water to prevent constipation 5)  Aspirin 81 Mg Tbec (Aspirin) .Marland Kitchen.. 1 tab daily 6)  Thera-m Tabs (Multiple vitamins-minerals) .... Take 1 by mouth once daily 7)  Preservision/lutein Caps (Multiple vitamins-minerals) .... Take 1 by mouth once daily 8)  Vitamin D 1000 Unit Tabs (Cholecalciferol) .... Take 1 by mouth once daily 9)  Calcium-vitamin D3 600-200 Mg-unit Tabs (Calcium carbonate-vitamin d) .... Take 1 by mouth once daily 10)  Omega-3 350 Mg Caps (Omega-3 fatty acids) .... Take 1 by mouth once daily 11)  Osteo Bi-flex Regular Strength 250-200 Mg Tabs (Glucosamine-chondroitin) .... Take 1 by mouth once daily 12)  Vitamin B-12 1000 Mcg Tabs (Cyanocobalamin) .... Take 1 by mouth once daily 13)  Lutein 20 Mg Tabs (Lutein) .... Take 1 by mouth once daily 14)  B Complex 100 Tr Cr-tabs (B complex vitamins) .... Take 1 by mouth once daily  Patient Instructions: 1)  Great to meet you, Ms. [name]. 2)  Please stop by to see Shirlee Limerick on your way out set up the studies on your foot. 3)  Try Lamisil over the counter, follow instrucitons on the bottle.  Current Allergies (reviewed today): No known allergies

## 2010-02-04 NOTE — Assessment & Plan Note (Signed)
Summary: ROA FOR 4 MONTH FOLLOW-UP/JRR   Vital Signs:  Patient profile:   77 year old female Weight:      130 pounds Temp:     97.6 degrees F oral BP sitting:   158 / 80  (left arm) Cuff size:   regular  Vitals Entered By: Mervin Hack CMA [facility] Dull) (December 09, 2009 10:35 AM) CC: 4 month follow-up   History of Present Illness: Got lost getting here--went too far  Late and now frazzled Busy the last 2 nights with the "[place] [employer] at [facility] fairly well in general Having some edema in feet today and recently some pain in lower calves Scaly skin in calves  Notes dry eyes No sig pain  Still uses depends at night occ daytime incontinence Has resigned herself to this  No chest pain--but occ gets a "check", like she can't swallow well No SOB No sig heartburn Did have esophageal dilation in past  Allergies: No Known Drug Allergies  Past History:  Past medical, surgical, family and social histories (including risk factors) reviewed for relevance to current acute and chronic problems.  Past Medical History: Reviewed history from 06/21/2009 and no changes required. Allergic rhinitis GERD Hyperlipidemia Hypertension Hypothyroidism Osteoporosis IBS/chronic constipation Right bundle branch block Chronic venous insufficiency Osteoarthritis  Past Surgical History: Reviewed history from 06/21/2009 and no changes required. Tonsillectomy and adenoidectomy 1920 Pneumonia  1992 Back surgery 2004 Vein ablation  ~2008  Family History: Reviewed history from 06/21/2009 and no changes required. Mom died of old age at 31 Dad died @67  of cerebral thrombosis 1 brother died of multiorgan failure 1 brother living  Social History: Reviewed history from 06/21/2009 and no changes required. 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 Daughter [name] is her health care POA. Has DNR and reviewed this order. Would not want tube feeds if cogitively unaware.  Review of Systems       appetite is okay weight is stable Sleeps okay---using 2 attends at night and able to go without changing better Has some right low back pain when "tired"--lateral lumbar.  Uses occ aleve and this helps, 2 is effective. Only occ needs  Physical Exam  General:  alert and normal appearance.   Neck:  supple, no masses, no thyromegaly, and no cervical lymphadenopathy.   Lungs:  normal respiratory effort, no intercostal retractions, and no accessory muscle use.  SLight coarse left basilar crackles No wheezes Heart:  normal rate, regular rhythm, and no gallop.   Soft systolic murmur loudest at base Extremities:  1+ edema to knee with slight tenderness Skin:  no suspicious lesions and no ulcerations.   Mycotic toenails without inflammation Psych:  normally interactive, good eye contact, not anxious appearing, and not depressed appearing.     Impression & Recommendations:  Problem # 1:  INCONTINENCE/ENURESIS, NOS (ICD-788.30) Assessment Comment Only stable will not try any other meds  Problem # 2:  VENOUS INSUFFICIENCY (ICD-459.81) Assessment: Deteriorated worse today after being up more this weekend no changes---she will keep them up till they are better  Problem # 3:  HYPOTHYROIDISM (ICD-244.9) Assessment: Unchanged  due for labs  Her updated medication list for this problem includes:    Levoxyl 75 Mcg Tabs (Levothyroxine sodium) .Marland Kitchen... Take 1 by mouth once daily  Orders: TLB-TSH (Thyroid Stimulating Hormone) (84443-TSH) TLB-T4 (Thyrox), Free [phone])  Problem # 4:  HYPERTENSION (ICD-401.9) Assessment: Unchanged  reasonable control for age with no orthostasis due for labs  Her updated medication list for this problem includes:    Metoprolol Tartrate 50 Mg Tabs (Metoprolol tartrate) .Marland Kitchen... Take 1 by mouth once  daily    Losartan Potassium-hctz 100-25 Mg Tabs (Losartan potassium-hctz) .Marland Kitchen... Take 1 by mouth once daily  BP today: 158/80 Prior BP: 148/80 (09/02/2009)  Orders: Venipuncture (37106) TLB-Renal Function Panel (80069-RENAL) TLB-CBC Platelet - w/Differential (85025-CBCD) TLB-Hepatic/Liver Function Pnl (80076-HEPATIC)  Complete Medication List: 1)  Metoprolol Tartrate 50 Mg Tabs (Metoprolol tartrate) .... Take 1 by mouth once daily 2)  Levoxyl 75 Mcg Tabs (Levothyroxine sodium) .... Take 1 by mouth once daily 3)  Losartan Potassium-hctz 100-25 Mg Tabs (Losartan potassium-hctz) .... Take 1 by mouth once daily 4)  Miralax Powd (Polyethylene glycol 3350) .Marland Kitchen.. 1 capful daily with water to prevent constipation as needed 5)  Aspirin 81 Mg Tbec (Aspirin) .Marland Kitchen.. 1 tab every other day 6)  Thera-m Tabs (Multiple vitamins-minerals) .... Take 1 by mouth once daily 7)  Preservision/lutein Caps (Multiple vitamins-minerals) .... Take 1 by mouth once daily 8)  Vitamin D 1000 Unit Tabs (Cholecalciferol) .... Take 1 by mouth once daily 9)  Calcium-vitamin D3 600-200 Mg-unit Tabs (Calcium carbonate-vitamin d) .... Take 1 by mouth once daily 10)  Omega-3 350 Mg Caps (Omega-3 fatty acids) .... Take 1 by mouth once daily 11)  Vitamin B-12 1000 Mcg Tabs (Cyanocobalamin) .... Take 1 by mouth once daily 12)  Lutein 20 Mg Tabs (Lutein) .... Take 1 by mouth once daily 13)  B Complex 100 Tr Cr-tabs (B complex vitamins) .... Take 1 by mouth once daily  Patient Instructions: 1)  Please schedule a follow-up appointment in 4 months .    Orders Added: 1)  Est. Patient Level IV [26948] 2)  Venipuncture [36415] 3)  TLB-Renal Function Panel [80069-RENAL] 4)  TLB-CBC Platelet - w/Differential [85025-CBCD] 5)  TLB-Hepatic/Liver Function Pnl [80076-HEPATIC] 6)  TLB-TSH (Thyroid Stimulating Hormone) [84443-TSH] 7)  TLB-T4 (Thyrox), Free [54627-OJ5K]    Current Allergies (reviewed today): No known allergies

## 2010-02-04 NOTE — Progress Notes (Signed)
Summary: wants appt.   Phone Note Call from Patient   Caller: Angelica Chessman [phone] Call For: Cindee Salt MD Summary of Call: Angelica Chessman called to let you know that patient  has been having trouble urinating. She has been having to get up alot at night to go to the bathroom. Patient has tried vesicare and other meds related to vesicare and she can't take these due to the drying affect. She is wanting an appt as soon as possible. Your schedule is pretty far out, can she be added on any sooner. Please adviise.  Initial call taken by: Melody Comas,  July 24, 2009 1:55 PM  Follow-up for Phone Call        Okay to add on at 4:30PM tomorrow Follow-up by: Cindee Salt MD,  July 24, 2009 2:02 PM  Additional Follow-up for Phone Call Additional follow up Details #1::        I left a message on [name]'s voice mail to let her know I was adding pt. on to schedule tomorrow @ 4:30.  I asked Angelica Chessman to call me back to confirm. Additional Follow-up by: Beau Fanny,  July 24, 2009 2:12 PM

## 2010-02-04 NOTE — Progress Notes (Signed)
Summary: pt requests phone call  Phone Note Call from Patient Call back at Home Phone [phone]   Caller: Patient Call For: Cindee Salt MD Summary of Call: Pt would like for you to call her at your convenience.  She would like to tell you about the food she and her friends collected for donation. Initial call taken by: Lowella Petties CMA, AAMA,  December 10, 2009 2:55 PM  Follow-up for Phone Call        message left I had heard that 18 tons of food were collected Follow-up by: Cindee Salt MD,  December 11, 2009 2:01 PM

## 2010-02-06 NOTE — Progress Notes (Signed)
Summary: had episode this morning  Phone Note Call from Patient Call back at Home Phone [phone]   Caller: Daughter  [name]  [phone] Summary of Call: Pt's daughter states pt has had some problems this morning.  Had nausea and felt weak.  BP was checked at 108/53.  She had been off of metoprolol for one month and took one last night.  Daughter is asking if that could be what caused episode this morning.  She is feeling better now, ate a banana and improved.  Had breakfast and is now back to normal.  Please advise. Initial call taken by: Lowella Petties CMA, AAMA,  January 21, 2010 8:46 AM  Follow-up for Phone Call        please add on at 12:45Pm today if she can come in Follow-up by: Cindee Salt MD,  January 21, 2010 10:06 AM  Additional Follow-up for Phone Call Additional follow up Details #1::        appt scheduled today at 12:45 Additional Follow-up by: Mervin Hack CMA Duncan Dull),  January 21, 2010 10:17 AM

## 2010-02-06 NOTE — Letter (Signed)
Summary: E-mail from [facility]  E-mail from [facility]   Imported By: Beau Fanny 01/21/2010 14:45:15  _____________________________________________________________________  External Attachment:    Type:   Image     Comment:   External Document

## 2010-02-06 NOTE — Assessment & Plan Note (Signed)
Summary: 12:45 per Dr.Dyan Labarbera/ds   Vital Signs:  Patient profile:   77 year old female Weight:      128 pounds Temp:     98.3 degrees F oral Pulse rate:   74 / minute Pulse rhythm:   regular BP sitting:   148 / 64  (left arm) Cuff size:   regular  Vitals Entered By: Mervin Hack CMA Duncan Dull) (January 21, 2010 1:06 PM) CC: blood pressure   History of Present Illness: Hadn't been taking her metoprolol for about a month Ran out and was visiting daughter then  took it last night for the first time in  ~3 weeks Had gone shopping with daughter in [place] ---had a late dinner Had spell this AM --emergency call activated Called to daughter in next room Doubled over the sink Couldn't get her to move Not speaking---?some slurring Took some effort to get her sitting  Then asked for banana and felt better total time 3-5 minutes where she was not right Back to normal within about 10 minutes (and by the time the RN got there) BP 108/56 then  Had trouble remembering aspects of her day (per daughter) this is before the spell today   Allergies: No Known Drug Allergies  Past History:  Past medical, surgical, family and social histories (including risk factors) reviewed for relevance to current acute and chronic problems.  Past Medical History: Reviewed history from 06/21/2009 and no changes required. Allergic rhinitis GERD Hyperlipidemia Hypertension Hypothyroidism Osteoporosis IBS/chronic constipation Right bundle branch block Chronic venous insufficiency Osteoarthritis  Past Surgical History: Reviewed history from 06/21/2009 and no changes required. Tonsillectomy and adenoidectomy 1920 Pneumonia  1992 Back surgery 2004 Vein ablation  ~2008  Family History: Reviewed history from 06/21/2009 and no changes required. Mom died of old age at 8 Dad died @67  of cerebral thrombosis 1 brother died of multiorgan failure 1 brother living  Social History: Reviewed  history from 06/21/2009 and no changes required. 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 Daughter [name] is her health care POA. Has DNR and reviewed this order. Would not want tube feeds if cogitively unaware.  Review of Systems       Didn't sleep well last night---still troubled by the incontinence Did not prepare for sleep properly last night-- lights on, knocked over some cosmetics, etc  Physical Exam  General:  alert and normal appearance.   Eyes:  pupils equal, pupils round, pupils reactive to light, and no nystagmus.   Mouth:  no erythema and no exudates.   Lungs:  normal respiratory effort, no intercostal retractions, and no accessory muscle use.  Slight basilar crackles Heart:  normal rate, regular rhythm, and no gallop.   Neurologic:  alert & oriented X3, cranial nerves II-XII intact, strength normal in all extremities, finger-to-nose normal, and Romberg negative.  Slight instabilty in gait but not ataxic Psych:  normally interactive, good eye contact, not anxious appearing, and not depressed appearing.     Impression & Recommendations:  Problem # 1:  TIA (ICD-435.9) Assessment New  sounds like it may have been a hypotensive episode had restarted metoprolol short acting last night neuro exam reassuring now  P: will stop the metoprolol     will have [name] RN monitor BP  Her updated medication list for this problem includes:    Aspirin 81 Mg Tbec (Aspirin) .Marland Kitchen... 1 tab every other day  Orders: Venipuncture (84132) TLB-Renal Function Panel (80069-RENAL) TLB-CBC Platelet - w/Differential (85025-CBCD) TLB-Hepatic/Liver Function Pnl (80076-HEPATIC) TLB-TSH (Thyroid Stimulating Hormone) (84443-TSH)  Complete Medication List: 1)  Levoxyl 75 Mcg Tabs (Levothyroxine sodium) .... Take 1 by mouth once daily 2)  Losartan Potassium-hctz  100-25 Mg Tabs (Losartan potassium-hctz) .... Take 1 by mouth once daily 3)  Miralax Powd (Polyethylene glycol 3350) .Marland Kitchen.. 1 capful daily with water to prevent constipation as needed 4)  Aspirin 81 Mg Tbec (Aspirin) .Marland Kitchen.. 1 tab every other day 5)  Thera-m Tabs (Multiple vitamins-minerals) .... Take 1 by mouth once daily 6)  Preservision/lutein Caps (Multiple vitamins-minerals) .... Take 1 by mouth once daily 7)  Vitamin D 1000 Unit Tabs (Cholecalciferol) .... Take 1 by mouth once daily 8)  Calcium-vitamin D3 600-200 Mg-unit Tabs (Calcium carbonate-vitamin d) .... Take 1 by mouth once daily 9)  Omega-3 350 Mg Caps (Omega-3 fatty acids) .... Take 1 by mouth once daily 10)  Vitamin B-12 1000 Mcg Tabs (Cyanocobalamin) .... Take 1 by mouth once daily 11)  Lutein 20 Mg Tabs (Lutein) .... Take 1 by mouth once daily 12)  B Complex 100 Tr Cr-tabs (B complex vitamins) .... Take 1 by mouth once daily  Patient Instructions: 1)  Stop the metoprolol 2)  Please schedule a follow-up appointment in 1 month.    Orders Added: 1)  Venipuncture [36415] 2)  TLB-Renal Function Panel [80069-RENAL] 3)  TLB-CBC Platelet - w/Differential [85025-CBCD] 4)  TLB-Hepatic/Liver Function Pnl [80076-HEPATIC] 5)  TLB-TSH (Thyroid Stimulating Hormone) [44010-UVO]    Current Allergies (reviewed today): No known allergies

## 2010-02-18 ENCOUNTER — Encounter: Payer: Self-pay | Admitting: Internal Medicine

## 2010-02-21 ENCOUNTER — Ambulatory Visit (INDEPENDENT_AMBULATORY_CARE_PROVIDER_SITE_OTHER): Payer: Medicare Other | Admitting: Internal Medicine

## 2010-02-21 ENCOUNTER — Encounter: Payer: Self-pay | Admitting: Internal Medicine

## 2010-02-21 DIAGNOSIS — I872 Venous insufficiency (chronic) (peripheral): Secondary | ICD-10-CM

## 2010-02-21 DIAGNOSIS — I10 Essential (primary) hypertension: Secondary | ICD-10-CM

## 2010-02-21 DIAGNOSIS — G459 Transient cerebral ischemic attack, unspecified: Secondary | ICD-10-CM

## 2010-02-26 NOTE — Assessment & Plan Note (Signed)
Summary: 1 m roa cyd   Vital Signs:  Patient profile:   77 year old female Weight:      130 pounds Temp:     97.8 degrees F oral Pulse rate:   69 / minute Pulse rhythm:   regular BP sitting:   144 / 92  (right arm) Cuff size:   regular  Vitals Entered By: Mervin Hack CMA Duncan Dull) (February 21, 2010 11:20 AM) CC: follow-up   History of Present Illness: She is in with daughter again She feels well No further spells has been keeping track of her blood pressure [name] RN has been checking----  138/92, 185/89, then 162/92  No headaches no dizziness No chest pain No palpitiations  Has had some pain in ankles  some skin sloughing  Allergies: No Known Drug Allergies  Past History:  Past medical, surgical, family and social histories (including risk factors) reviewed for relevance to current acute and chronic problems.  Past Medical History: Reviewed history from 06/21/2009 and no changes required. Allergic rhinitis GERD Hyperlipidemia Hypertension Hypothyroidism Osteoporosis IBS/chronic constipation Right bundle branch block Chronic venous insufficiency Osteoarthritis  Past Surgical History: Reviewed history from 06/21/2009 and no changes required. Tonsillectomy and adenoidectomy 1920 Pneumonia  1992 Back surgery 2004 Vein ablation  ~2008  Family History: Reviewed history from 06/21/2009 and no changes required. Mom died of old age at 82 Dad died @67  of cerebral thrombosis 1 brother died of multiorgan failure 1 brother living  Social History: Reviewed history from 06/21/2009 and no changes required. 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 Daughter [name] is her health care POA. Has DNR and reviewed this order. Would not want tube feeds if cogitively unaware.  Review of Systems       Appetite is  great weight up 2# sleeps okay despite nocturia Stays active---lots of activities  Physical Exam  General:  alert and normal appearance.   Neck:  supple, no masses, and no cervical lymphadenopathy.   Lungs:  normal respiratory effort, no intercostal retractions, and no accessory muscle use.   Slight bibasilar crackles Heart:  normal rate, regular rhythm, no murmur, and no gallop.   Extremities:  1+ pittting edema on right  no sig edema on left Skin:  venous stasis changes on right with redness and skin sloughing laterally just above ankle no ulcers Psych:  normally interactive, good eye contact, not anxious appearing, and not depressed appearing.     Impression & Recommendations:  Problem # 1:  TIA (ICD-435.9) Assessment Improved no more spells probably a hypotensive spell --not neurologic  Her updated medication list for this problem includes:    Aspirin 81 Mg Tbec (Aspirin) .Marland Kitchen... 1 tab every other day  Problem # 2:  HYPERTENSION (ICD-401.9) Assessment: Comment Only BP is okay off the metoprolol no changes for now  Her updated medication list for this problem includes:    Losartan Potassium-hctz 100-25 Mg Tabs (Losartan potassium-hctz) .Marland Kitchen... Take 1 by mouth once daily  BP today: 144/92 Prior BP: 148/64 (01/21/2010)  Labs Reviewed: K+: 3.9 (01/21/2010) Creat: : 0.5 (01/21/2010)     Problem # 3:  VENOUS INSUFFICIENCY (ICD-459.81) Assessment: Deteriorated having trouble with right leg despite support hose not able to tolerate fluid pills discussed avoiding salt (she does) and elevation skin moisturizers  Complete Medication List: 1)  Levoxyl 75 Mcg Tabs (Levothyroxine sodium) .... Take 1 by mouth once daily  2)  Losartan Potassium-hctz 100-25 Mg Tabs (Losartan potassium-hctz) .... Take 1 by mouth once daily 3)  Miralax Powd (Polyethylene glycol 3350) .Marland Kitchen.. 1 capful daily with water to prevent constipation as needed 4)  Aspirin 81 Mg Tbec (Aspirin) .Marland Kitchen.. 1 tab every  other day 5)  Thera-m Tabs (Multiple vitamins-minerals) .... Take 1 by mouth once daily 6)  Preservision/lutein Caps (Multiple vitamins-minerals) .... Take 1 by mouth once daily 7)  Vitamin D 1000 Unit Tabs (Cholecalciferol) .... Take 1 by mouth once daily 8)  Calcium-vitamin D3 600-200 Mg-unit Tabs (Calcium carbonate-vitamin d) .... Take 1 by mouth once daily 9)  Omega-3 350 Mg Caps (Omega-3 fatty acids) .... Take 1 by mouth once daily 10)  Vitamin B-12 1000 Mcg Tabs (Cyanocobalamin) .... Take 1 by mouth once daily 11)  Lutein 20 Mg Tabs (Lutein) .... Take 1 by mouth once daily 12)  B Complex 100 Tr Cr-tabs (B complex vitamins) .... Take 1 by mouth once daily  Patient Instructions: 1)  Please schedule a follow-up appointment in 4 months .    Orders Added: 1)  Est. Patient Level IV [40102]    Current Allergies (reviewed today): No known allergies

## 2010-03-06 ENCOUNTER — Ambulatory Visit: Payer: BC Managed Care – PPO | Admitting: Family Medicine

## 2010-03-10 ENCOUNTER — Ambulatory Visit (INDEPENDENT_AMBULATORY_CARE_PROVIDER_SITE_OTHER): Payer: Medicare Other | Admitting: Family Medicine

## 2010-03-10 ENCOUNTER — Encounter: Payer: Self-pay | Admitting: Family Medicine

## 2010-03-10 DIAGNOSIS — N390 Urinary tract infection, site not specified: Secondary | ICD-10-CM

## 2010-03-10 LAB — CONVERTED CEMR LAB
Ketones, urine, test strip: NEGATIVE
Nitrite: POSITIVE
Specific Gravity, Urine: 1.02
Urobilinogen, UA: 0.2

## 2010-03-11 ENCOUNTER — Encounter: Payer: Self-pay | Admitting: Family Medicine

## 2010-03-18 NOTE — Assessment & Plan Note (Signed)
Summary: UTI / LFW   Vital Signs:  Patient profile:   77 year old female Height:      62 inches Weight:      130 pounds BMI:     23.86 Temp:     97.7 degrees F oral Pulse rate:   84 / minute Pulse rhythm:   regular BP sitting:   102 / 60  (left arm) Cuff size:   regular  Vitals Entered By: Delilah Shan CMA [place] Goh Dull) (March 10, 2010 4:17 PM)  Serial Vital Signs/Assessments:  Time      Position  BP       Pulse  Resp  Temp     By                     120/70                         Crawford Givens MD  CC: ? UTI   History of Present Illness: dysuria: yes, some relief with AZO/cranberry juice duration of symptoms: since Tuesday abdominal pain: no fevers: no back pain: no vomiting: no other concerns: no  Allergies: No Known Drug Allergies  Review of Systems       See HPI.  Otherwise negative.    Physical Exam  General:  GEN: nad, alert and oriented HEENT: mucous membranes moist NECK: supple CV: rrr.  PULM: ctab, no inc wob ABD: soft, +bs, suprapubic area not tender EXT: no edema, in compression stockings SKIN: no acute rash BACK: no CVA pain    Impression & Recommendations:  Problem # 1:  UTI (ICD-599.0) start septra, check ucx and follow up as needed.  nontoxic.  she agrees.   Orders: Prescription Created Electronically [id number]) Specimen Handling (60454) T-Culture, Urine ([id number])  Her updated medication list for this problem includes:    Septra Ds 800-160 Mg Tabs (Sulfamethoxazole-trimethoprim) .Marland Kitchen... 1 by mouth two times a day x3 days.  Complete Medication List: 1)  Levoxyl 75 Mcg Tabs (Levothyroxine sodium) .... Take 1 by mouth once daily 2)  Losartan Potassium-hctz 100-25 Mg Tabs (Losartan potassium-hctz) .... Take 1 by mouth once daily 3)  Miralax Powd (Polyethylene glycol 3350) .Marland Kitchen.. 1 capful daily with water to prevent constipation as needed 4)  Aspirin 81 Mg Tbec (Aspirin) .Marland Kitchen.. 1 tab every other day 5)  Thera-m Tabs (Multiple vitamins-minerals)  .... Take 1 by mouth once daily 6)  Preservision/lutein Caps (Multiple vitamins-minerals) .... Take 1 by mouth once daily 7)  Vitamin D 1000 Unit Tabs (Cholecalciferol) .... Take 1 by mouth once daily 8)  Calcium-vitamin D3 600-200 Mg-unit Tabs (Calcium carbonate-vitamin d) .... Take 1 by mouth once daily 9)  Omega-3 350 Mg Caps (Omega-3 fatty acids) .... Take 1 by mouth once daily 10)  Vitamin B-12 1000 Mcg Tabs (Cyanocobalamin) .... Take 1 by mouth once daily 11)  Lutein 20 Mg Tabs (Lutein) .... Take 1 by mouth once daily 12)  B Complex 100 Tr Cr-tabs (B complex vitamins) .... Take 1 by mouth once daily 13)  X-viate 40 % Crea (Urea) .... Apply to nail at bedtime 14)  Aquaphor Advanced Therapy Oint (Emollient) .... Apply to affected areas as needed 15)  Genteal 0.3 % Soln (Hypromellose) .... As needed 16)  Triamcinolone Acetonide 0.1 % Crea (Triamcinolone acetonide) .... Apply to lower legs two times a day until clear 17)  Septra Ds 800-160 Mg Tabs (Sulfamethoxazole-trimethoprim) .Marland Kitchen.. 1 by mouth two times a day x3  days.  Other Orders: UA Dipstick w/o Micro (manual) (60454)  Patient Instructions: 1)  Drink plenty of fluids.  Cranberry juice is especially recommended in addition to large amounts of water. Avoid caffeine & carbonated drinks, they tend to irritate the bladder, Return in 3-5 days if you're not better: sooner if you're feeling worse.  We'll contact you with your lab report. Take care.  Prescriptions: SEPTRA DS 800-160 MG TABS (SULFAMETHOXAZOLE-TRIMETHOPRIM) 1 by mouth two times a day x3 days.  #6 x 0   Entered and Authorized by:   Crawford Givens MD   Signed by:   Crawford Givens MD on 03/10/2010   Method used:   Electronically to        [facility]* (retail)       [address]       [place], [place]  [address]       Ph: [phone]       Fax: [phone]   RxID:   [id number]    Orders Added: 1)  UA Dipstick w/o Micro (manual) [81002] 2)   Prescription Created Electronically [[id number]] 3)  Est. Patient Level III [27253] 4)  Specimen Handling [99000] 5)  T-Culture, Urine [[id number]]    Current Allergies (reviewed today): No known allergies   Laboratory Results   Urine Tests  Date/Time Received: March 10, 2010 4:42 PM   Routine Urinalysis   Color: yellow Appearance: Cloudy Glucose: negative   (Normal Range: Negative) Bilirubin: negative   (Normal Range: Negative) Ketone: negative   (Normal Range: Negative) Spec. Gravity: 1.020   (Normal Range: 1.003-1.035) Blood: small   (Normal Range: Negative) pH: 6.0   (Normal Range: 5.0-8.0) Protein: trace   (Normal Range: Negative) Urobilinogen: 0.2   (Normal Range: 0-1) Nitrite: positive   (Normal Range: Negative) Leukocyte Esterace: trace   (Normal Range: Negative)

## 2010-04-09 ENCOUNTER — Encounter: Payer: Self-pay | Admitting: Internal Medicine

## 2010-04-09 ENCOUNTER — Ambulatory Visit (INDEPENDENT_AMBULATORY_CARE_PROVIDER_SITE_OTHER): Payer: Medicare Other | Admitting: Internal Medicine

## 2010-04-09 VITALS — BP 158/80 | HR 67 | Temp 98.4°F | Ht 62.0 in | Wt 130.0 lb

## 2010-04-09 DIAGNOSIS — M1990 Unspecified osteoarthritis, unspecified site: Secondary | ICD-10-CM

## 2010-04-09 DIAGNOSIS — I10 Essential (primary) hypertension: Secondary | ICD-10-CM

## 2010-04-09 DIAGNOSIS — K5909 Other constipation: Secondary | ICD-10-CM

## 2010-04-09 DIAGNOSIS — I872 Venous insufficiency (chronic) (peripheral): Secondary | ICD-10-CM

## 2010-04-09 DIAGNOSIS — K219 Gastro-esophageal reflux disease without esophagitis: Secondary | ICD-10-CM

## 2010-04-09 NOTE — Progress Notes (Signed)
  Subjective:    Patient ID: Stacey Blankenship, female    DOB: 05/10/1915, 77 y.o.   MRN: [id number]  HPI DOing well Able to drive here herself  Has gained a little weight Clothes not fitting as well  Ongoing urinary incontinence Uses attends regularly---occ needs 2 at night despite watching fluid intake Urine infection seems to have resolved DRinks cranberry juice regularly--discussed trying cranberry tablets if she prefers  Bowels have been okay Uses the miralax--only needs 2 teaspoons daily Heartubrn quiet --not needing meds  No chest pain Gets DOE if overdoes it--no change in tolerance No dizziness or syncope No headache Minor edema still  No major arthritis pain occ trouble with right hip  Past Medical History  Diagnosis Date  . Allergy   . GERD (gastroesophageal reflux disease)   . Hyperlipidemia   . Hypertension   . Hyperthyroidism   . Osteoporosis   . IBS (irritable bowel syndrome)   . Bundle branch block   . Chronic venous insufficiency     Past Surgical History  Procedure Date  . Tonsillectomy and adenoidectomy   . Appendectomy 01/05/1918  . Pneumonia 01/05/1990  . Back surgery 01/05/2002  . Ablation saphenous vein w/ rfa 01/05/2006    No family history on file.  History   Social History  . Marital Status: Widowed    Spouse Name: N/A    Number of Children: 2  . Years of Education: N/A   Occupational History  . Retired Engineer, site    Social History Main Topics  . Smoking status: Former Smoker    Quit date: 02/18/2005  . Smokeless tobacco: Not on file  . Alcohol Use: Yes  . Drug Use:   . Sexually Active:    Other Topics Concern  . Not on file   Social History Narrative   Has living willDaughter Corrie Blankenship is her health care POAHas DNR and reviewed this orderWould not want tube feeds if cognitively unaware     Review of Systems Appetite is fine Weight is up a few pounds Generally sleeps okay Mood has been good    Objective:   Physical Exam  Constitutional: She appears well-developed and well-nourished. No distress.  Neck: Normal range of motion. Neck supple. No thyromegaly present.  Cardiovascular: Normal rate, regular rhythm and normal heart sounds.  Exam reveals no gallop.   No murmur heard. Pulmonary/Chest: Effort normal and breath sounds normal. No respiratory distress. She has no wheezes. She has no rales.  Abdominal: Soft. There is no tenderness.  Musculoskeletal: Normal range of motion. She exhibits edema. She exhibits no tenderness.       Trace edema only  Lymphadenopathy:    She has no cervical adenopathy.  Psychiatric: She has a normal mood and affect. Her behavior is normal. Judgment and thought content normal.          Assessment & Plan:

## 2010-04-18 ENCOUNTER — Other Ambulatory Visit: Payer: Self-pay | Admitting: *Deleted

## 2010-04-18 MED ORDER — POLYETHYLENE GLYCOL 3350 17 GM/SCOOP PO POWD: 17.0000 g | Freq: Every day | ORAL | Status: DC

## 2010-06-25 ENCOUNTER — Ambulatory Visit: Payer: BC Managed Care – PPO | Admitting: Internal Medicine

## 2010-08-11 ENCOUNTER — Encounter: Payer: Self-pay | Admitting: Internal Medicine

## 2010-08-11 ENCOUNTER — Ambulatory Visit (INDEPENDENT_AMBULATORY_CARE_PROVIDER_SITE_OTHER): Payer: Medicare Other | Admitting: Internal Medicine

## 2010-08-11 DIAGNOSIS — K5909 Other constipation: Secondary | ICD-10-CM

## 2010-08-11 DIAGNOSIS — I10 Essential (primary) hypertension: Secondary | ICD-10-CM

## 2010-08-11 DIAGNOSIS — M1990 Unspecified osteoarthritis, unspecified site: Secondary | ICD-10-CM

## 2010-08-11 DIAGNOSIS — I872 Venous insufficiency (chronic) (peripheral): Secondary | ICD-10-CM

## 2010-08-11 DIAGNOSIS — R32 Unspecified urinary incontinence: Secondary | ICD-10-CM

## 2010-08-11 NOTE — Assessment & Plan Note (Signed)
BP Readings from Last 3 Encounters:  08/11/10 130/88  04/09/10 158/80  03/10/10 102/60   Good control No changes needed Lab Results  Component Value Date   CREATININE 0.5 01/21/2010

## 2010-08-11 NOTE — Assessment & Plan Note (Signed)
More stiffness than pain--esp when getting up out of chair

## 2010-08-11 NOTE — Assessment & Plan Note (Signed)
Mild edema Discussed elevation Wasn't happy with vascular evaluation

## 2010-08-11 NOTE — Progress Notes (Signed)
Subjective:    Patient ID: Stacey Blankenship, female    DOB: 11-11-1915, 77 y.o.   MRN: [id number]  HPI Noted a problem with her eyes Saw Dr Dorcas Mcmurray Macular degeneration went from dry to wet Seen by Dr Champ Mungo Has had 3 shots and this has helped some  Notes more abdominal swelling Thinks it may be gas since it is comes and goes--gets rumbling Bowels are fine with the miralax---still only needs 2 heaping teaspoons a day  No recent UTIs Still has incontinence problems Has to be careful with fluid intake Forgets cranberry pill  Gets edema at times No sig breathing problems No chest pain  Current Outpatient Prescriptions on File Prior to Visit  Medication Sig Dispense Refill  . aspirin 81 MG tablet Take 81 mg by mouth every other day.        . B Complex Vitamins (B COMPLEX 100 PO) Take 1 tablet by mouth daily.        . Calcium Carbonate-Vitamin D (CALCIUM-VITAMIN D3) 600-200 MG-UNIT TABS Take 1 tablet by mouth daily.        . Cholecalciferol (VITAMIN D) 1000 UNITS capsule Take 1,000 Units by mouth daily.        Marland Kitchen levothyroxine (SYNTHROID, LEVOTHROID) 75 MCG tablet Take 75 mcg by mouth daily.        Marland Kitchen losartan-hydrochlorothiazide (HYZAAR) 100-25 MG per tablet Take 1 tablet by mouth daily.        . Lutein 20 MG TABS Take 1 tablet by mouth daily.        . Multiple Vitamins-Minerals (PRESERVISION/LUTEIN PO) Take 1 capsule by mouth daily.        . multivitamin-iron-minerals-folic acid (THERAPEUTIC-M) TABS tablet Take 1 tablet by mouth daily.        . Omega-3 350 MG CAPS Take 1 capsule by mouth daily.        . polyethylene glycol powder (MIRALAX) powder Take 17 g by mouth daily.  255 g  20  . vitamin B-12 (CYANOCOBALAMIN) 1000 MCG tablet Take 1,000 mcg by mouth daily.          No Known Allergies  Past Medical History  Diagnosis Date  . Allergy   . GERD (gastroesophageal reflux disease)   . Hyperlipidemia   . Hypertension   . Hyperthyroidism   . Osteoporosis   . IBS  (irritable bowel syndrome)   . Bundle branch block   . Chronic venous insufficiency     Past Surgical History  Procedure Date  . Tonsillectomy and adenoidectomy   . Appendectomy 01/05/1918  . Pneumonia 01/05/1990  . Back surgery 01/05/2002  . Ablation saphenous vein w/ rfa 01/05/2006    No family history on file.  History   Social History  . Marital Status: Widowed    Spouse Name: N/A    Number of Children: 2  . Years of Education: N/A   Occupational History  . Retired Engineer, site    Social History Main Topics  . Smoking status: Former Smoker    Quit date: 02/18/2005  . Smokeless tobacco: Not on file  . Alcohol Use: Yes  . Drug Use:   . Sexually Active:    Other Topics Concern  . Not on file   Social History Narrative   Has living willDaughter [name] is her health care POAHas DNR and reviewed this orderWould not want tube feeds if cognitively unaware   Review of Systems Sleeps fairly well--but puts off bed because she misses her husband most when she  is in bed Appetite is fair Weight is stable    Objective:   Physical Exam  Constitutional: She appears well-developed and well-nourished. No distress.  Neck: Normal range of motion. No thyromegaly present.  Cardiovascular: Normal rate, regular rhythm and intact distal pulses.   Murmur heard.      Faint systolic murmur  Pulmonary/Chest: Effort normal and breath sounds normal. No respiratory distress. She has no wheezes. She has no rales.  Abdominal: Soft. She exhibits distension. There is no tenderness.  Musculoskeletal: She exhibits edema. She exhibits no tenderness.       1+ non pitting edema in calves  Lymphadenopathy:    She has no cervical adenopathy.  Psychiatric: She has a normal mood and affect. Her behavior is normal. Judgment and thought content normal.          Assessment & Plan:

## 2010-08-11 NOTE — Assessment & Plan Note (Signed)
Still worries about this and it bothers her Couldn't tolerate meds due to side effects on eyes

## 2010-08-11 NOTE — Assessment & Plan Note (Signed)
Bowels fine with low dose miralax Still some bloating---seems better using lactaid milk (as she drinks a fair amount)

## 2010-10-03 ENCOUNTER — Other Ambulatory Visit: Payer: Self-pay | Admitting: *Deleted

## 2010-10-03 MED ORDER — LOSARTAN POTASSIUM-HCTZ 100-25 MG PO TABS: 1.0000 | ORAL_TABLET | Freq: Every day | ORAL | Status: DC

## 2011-01-08 DIAGNOSIS — H353290 Exudative age-related macular degeneration, unspecified eye, stage unspecified: Secondary | ICD-10-CM | POA: Diagnosis not present

## 2011-01-13 ENCOUNTER — Other Ambulatory Visit: Payer: Self-pay | Admitting: *Deleted

## 2011-01-13 MED ORDER — ZOSTER VACCINE LIVE 19400 UNT/0.65ML ~~LOC~~ SOLR: 0.6500 mL | Freq: Once | SUBCUTANEOUS | Status: DC

## 2011-01-13 NOTE — Telephone Encounter (Signed)
[name] nurse at [facility] asked that rx for zostavax be sent to pharmacy. rx sent to pharmacy by e-script

## 2011-01-16 DIAGNOSIS — Z961 Presence of intraocular lens: Secondary | ICD-10-CM | POA: Diagnosis not present

## 2011-01-16 DIAGNOSIS — H2510 Age-related nuclear cataract, unspecified eye: Secondary | ICD-10-CM | POA: Diagnosis not present

## 2011-01-29 ENCOUNTER — Telehealth: Payer: Self-pay | Admitting: Internal Medicine

## 2011-01-29 DIAGNOSIS — R05 Cough: Secondary | ICD-10-CM

## 2011-01-29 DIAGNOSIS — R1312 Dysphagia, oropharyngeal phase: Secondary | ICD-10-CM | POA: Diagnosis not present

## 2011-01-29 MED ORDER — OMEPRAZOLE 20 MG PO CPDR: 20.0000 mg | DELAYED_RELEASE_CAPSULE | Freq: Every day | ORAL | Status: DC

## 2011-01-29 NOTE — Telephone Encounter (Signed)
seen in health care as respite Had event that sounds like it was esophageal Will be going home tomorrow Starting on omeprazole

## 2011-02-10 ENCOUNTER — Ambulatory Visit: Payer: Medicare Other | Admitting: Internal Medicine

## 2011-02-10 DIAGNOSIS — Z0289 Encounter for other administrative examinations: Secondary | ICD-10-CM

## 2011-02-16 DIAGNOSIS — I119 Hypertensive heart disease without heart failure: Secondary | ICD-10-CM | POA: Diagnosis not present

## 2011-02-16 DIAGNOSIS — I059 Rheumatic mitral valve disease, unspecified: Secondary | ICD-10-CM | POA: Diagnosis not present

## 2011-02-16 DIAGNOSIS — E876 Hypokalemia: Secondary | ICD-10-CM | POA: Diagnosis not present

## 2011-02-27 DIAGNOSIS — H353290 Exudative age-related macular degeneration, unspecified eye, stage unspecified: Secondary | ICD-10-CM | POA: Diagnosis not present

## 2011-03-24 ENCOUNTER — Other Ambulatory Visit: Payer: Self-pay | Admitting: *Deleted

## 2011-03-24 MED ORDER — LEVOTHYROXINE SODIUM 75 MCG PO TABS: 75.0000 ug | ORAL_TABLET | Freq: Every day | ORAL | Status: DC

## 2011-03-27 ENCOUNTER — Ambulatory Visit (INDEPENDENT_AMBULATORY_CARE_PROVIDER_SITE_OTHER): Payer: Medicare Other | Admitting: Internal Medicine

## 2011-03-27 ENCOUNTER — Encounter: Payer: Self-pay | Admitting: Internal Medicine

## 2011-03-27 VITALS — BP 140/80 | HR 72 | Temp 97.8°F | Ht 62.0 in | Wt 131.0 lb

## 2011-03-27 DIAGNOSIS — I10 Essential (primary) hypertension: Secondary | ICD-10-CM | POA: Diagnosis not present

## 2011-03-27 DIAGNOSIS — I872 Venous insufficiency (chronic) (peripheral): Secondary | ICD-10-CM

## 2011-03-27 DIAGNOSIS — E039 Hypothyroidism, unspecified: Secondary | ICD-10-CM

## 2011-03-27 LAB — HEPATIC FUNCTION PANEL
ALT: 26 U/L (ref 0–35)
AST: 41 U/L — ABNORMAL HIGH (ref 0–37)
Albumin: 4 g/dL (ref 3.5–5.2)
Alkaline Phosphatase: 60 U/L (ref 39–117)
Bilirubin, Direct: 0 mg/dL (ref 0.0–0.3)
Total Bilirubin: 0.5 mg/dL (ref 0.3–1.2)
Total Protein: 7 g/dL (ref 6.0–8.3)

## 2011-03-27 LAB — CBC WITH DIFFERENTIAL/PLATELET
Basophils Absolute: 0 10*3/uL (ref 0.0–0.1)
Basophils Relative: 0.2 % (ref 0.0–3.0)
Eosinophils Absolute: 0 10*3/uL (ref 0.0–0.7)
Eosinophils Relative: 0.5 % (ref 0.0–5.0)
HCT: 43 % (ref 36.0–46.0)
Hemoglobin: 14.5 g/dL (ref 12.0–15.0)
Lymphocytes Relative: 16.9 % (ref 12.0–46.0)
Lymphs Abs: 1 10*3/uL (ref 0.7–4.0)
MCHC: 33.8 g/dL (ref 30.0–36.0)
MCV: 90.4 fl (ref 78.0–100.0)
Monocytes Absolute: 0.4 10*3/uL (ref 0.1–1.0)
Monocytes Relative: 6.1 % (ref 3.0–12.0)
Neutro Abs: 4.5 10*3/uL (ref 1.4–7.7)
Neutrophils Relative %: 76.3 % (ref 43.0–77.0)
Platelets: 140 10*3/uL — ABNORMAL LOW (ref 150.0–400.0)
RBC: 4.75 Mil/uL (ref 3.87–5.11)
RDW: 12.7 % (ref 11.5–14.6)
WBC: 5.9 10*3/uL (ref 4.5–10.5)

## 2011-03-27 LAB — BASIC METABOLIC PANEL
BUN: 30 mg/dL — ABNORMAL HIGH (ref 6–23)
CO2: 34 mEq/L — ABNORMAL HIGH (ref 19–32)
Chloride: 96 mEq/L (ref 96–112)
Creatinine, Ser: 0.8 mg/dL (ref 0.4–1.2)
Glucose, Bld: 124 mg/dL — ABNORMAL HIGH (ref 70–99)

## 2011-03-27 LAB — TSH: TSH: 1.7 u[IU]/mL (ref 0.35–5.50)

## 2011-03-27 NOTE — Assessment & Plan Note (Signed)
Edema controlled with support hose

## 2011-03-27 NOTE — Assessment & Plan Note (Signed)
Seems euthyroid ?Will check labs ?

## 2011-03-27 NOTE — Patient Instructions (Signed)
Please meet with Stacey Blankenship to work on a personalized fitness plan

## 2011-03-27 NOTE — Progress Notes (Signed)
Subjective:    Patient ID: Stacey Blankenship, female    DOB: [birth year], 78 y.o.   MRN: [id number]  HPI Doing okay in general Continues on shots for macular degeneration Vision remains good  No headaches No chest pain No SOB at rest--but occ notes herself panting (if she moves too fast) Knows she is not exercising---discussed seeing Casimiro Needle the fitness coordinator No sig edema  Energy levels are okay Stomach is okay. Occ bloating No heartburn problems  Current Outpatient Prescriptions on File Prior to Visit  Medication Sig Dispense Refill  . aspirin 81 MG tablet Take 81 mg by mouth every other day.        . B Complex Vitamins (B COMPLEX 100 PO) Take 1 tablet by mouth daily.        . Calcium Carbonate-Vitamin D (CALCIUM-VITAMIN D3) 600-200 MG-UNIT TABS Take 1 tablet by mouth daily.        . Cholecalciferol (VITAMIN D) 1000 UNITS capsule Take 1,000 Units by mouth daily.        Marland Kitchen levothyroxine (SYNTHROID, LEVOTHROID) 75 MCG tablet Take 1 tablet (75 mcg total) by mouth daily.  30 tablet  3  . losartan-hydrochlorothiazide (HYZAAR) 100-25 MG per tablet Take 1 tablet by mouth daily.  30 tablet  11  . Multiple Vitamins-Minerals (PRESERVISION/LUTEIN PO) Take 1 capsule by mouth daily.        . multivitamin-iron-minerals-folic acid (THERAPEUTIC-M) TABS tablet Take 1 tablet by mouth daily.        . Omega-3 350 MG CAPS Take 1 capsule by mouth daily.        Marland Kitchen omeprazole (PRILOSEC) 20 MG capsule Take 1 capsule (20 mg total) by mouth daily.  30 capsule  11  . polyethylene glycol powder (MIRALAX) powder Take 17 g by mouth daily.  255 g  20  . vitamin B-12 (CYANOCOBALAMIN) 1000 MCG tablet Take 1,000 mcg by mouth daily.          No Known Allergies  Past Medical History  Diagnosis Date  . Allergy   . GERD (gastroesophageal reflux disease)   . Hyperlipidemia   . Hypertension   . Hyperthyroidism   . Osteoporosis   . IBS (irritable bowel syndrome)   . Bundle branch block   . Chronic venous  insufficiency     Past Surgical History  Procedure Date  . Tonsillectomy and adenoidectomy   . Appendectomy 01/05/1918  . Pneumonia 01/05/1990  . Back surgery 01/05/2002  . Ablation saphenous vein w/ rfa 01/05/2006    No family history on file.  History   Social History  . Marital Status: Widowed    Spouse Name: N/A    Number of Children: 2  . Years of Education: N/A   Occupational History  . Retired Engineer, site    Social History Main Topics  . Smoking status: Former Smoker    Quit date: 02/18/2005  . Smokeless tobacco: Not on file  . Alcohol Use: Yes  . Drug Use:   . Sexually Active:    Other Topics Concern  . Not on file   Social History Narrative   Has living willDaughter [name] is her health care POAHas DNR and reviewed this orderWould not want tube feeds if cognitively unaware   Review of Systems Weight is stable Appetite is fair Sleep is not great. Initiates fine but up with nocturia. Some trouble getting back to sleep    Objective:   Physical Exam  Constitutional: She appears well-developed and well-nourished. No distress.  Neck:  Normal range of motion. Neck supple. No thyromegaly present.  Cardiovascular: Normal rate, regular rhythm and normal heart sounds.  Exam reveals no gallop.   No murmur heard.      Faint distal pulses  Pulmonary/Chest: Effort normal and breath sounds normal. No respiratory distress. She has no wheezes. She has no rales.  Abdominal: Soft. There is no tenderness.  Musculoskeletal: She exhibits no edema and no tenderness.  Lymphadenopathy:    She has no cervical adenopathy.  Psychiatric: She has a normal mood and affect. Her behavior is normal. Judgment and thought content normal.          Assessment & Plan:

## 2011-03-27 NOTE — Assessment & Plan Note (Signed)
BP Readings from Last 3 Encounters:  03/27/11 140/80  08/11/10 130/88  04/09/10 158/80   Good control Due for labs

## 2011-03-30 ENCOUNTER — Encounter: Payer: Self-pay | Admitting: *Deleted

## 2011-04-21 DIAGNOSIS — H353290 Exudative age-related macular degeneration, unspecified eye, stage unspecified: Secondary | ICD-10-CM | POA: Diagnosis not present

## 2011-05-27 ENCOUNTER — Telehealth: Payer: Self-pay

## 2011-05-27 NOTE — Telephone Encounter (Signed)
Vernona Rieger with [employer] said  pts med transferred from [facility]; [facility] fixes pts pill box and pt not sure why taking Omeprazole. Re: to 01/29/11 note. Vernona Rieger said that was all she needed.

## 2011-06-10 ENCOUNTER — Other Ambulatory Visit: Payer: Self-pay | Admitting: *Deleted

## 2011-06-10 MED ORDER — POLYETHYLENE GLYCOL 3350 17 GM/SCOOP PO POWD: 17.0000 g | Freq: Every day | ORAL | Status: DC

## 2011-06-24 DIAGNOSIS — H353290 Exudative age-related macular degeneration, unspecified eye, stage unspecified: Secondary | ICD-10-CM | POA: Diagnosis not present

## 2011-07-23 ENCOUNTER — Other Ambulatory Visit: Payer: Self-pay | Admitting: *Deleted

## 2011-07-23 IMAGING — CT CT HEAD WITHOUT CONTRAST
2 series · 15 of 30 positions shown, 19 images · non-contrast
Comparison: none

REASON FOR EXAM: confusion
COMMENTS:   May transport without cardiac monitor

[Series 2: without · axial · non-contrast · 0.39mm/px · z∈[+396,+521]mm · 13 of 31 slices shown, 17 images]
[im 3/31  brain]
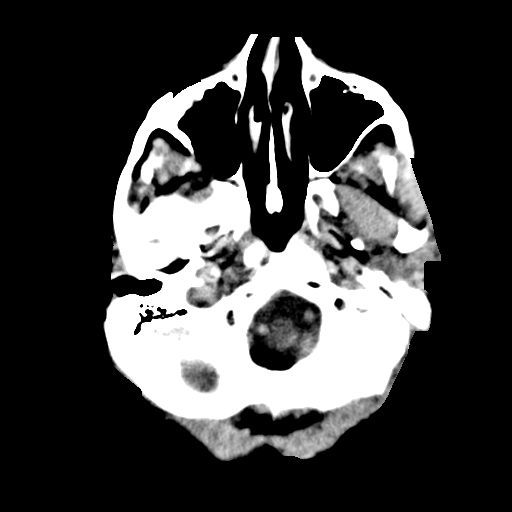
[im 3/31  bone]
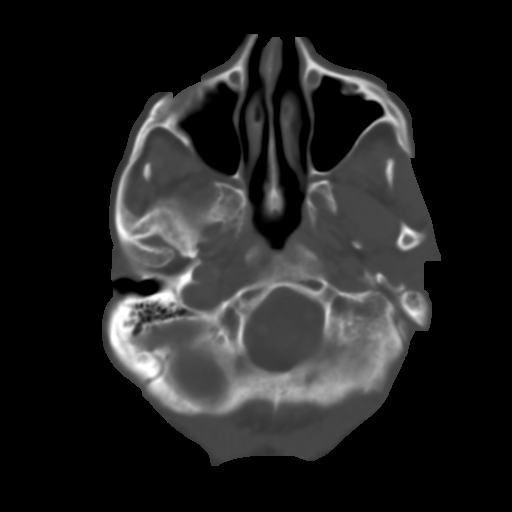
[im 5/31  brain]
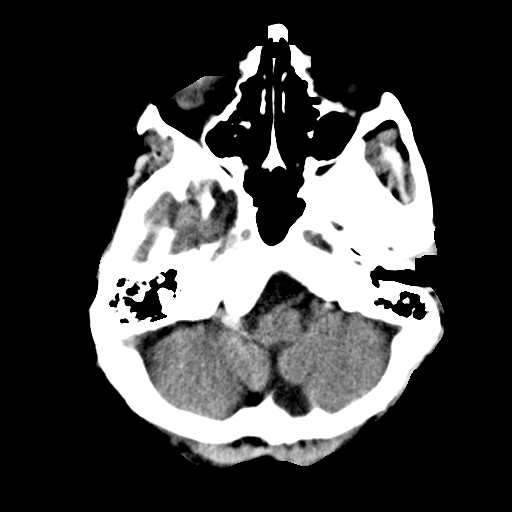
[im 7/31  brain]
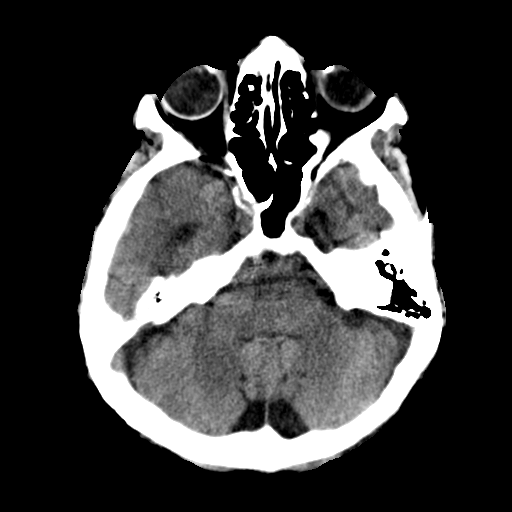
[im 9/31  brain]
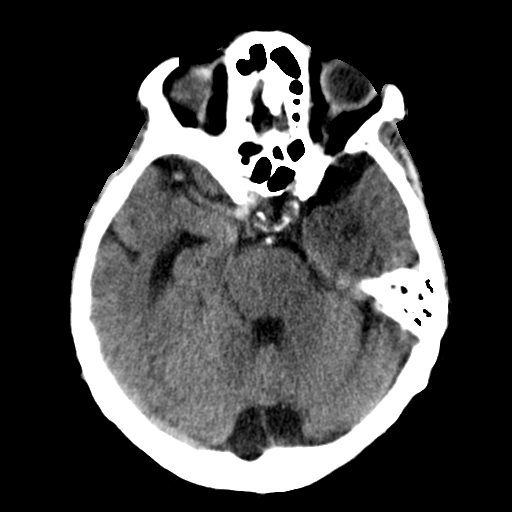
[im 11/31  brain]
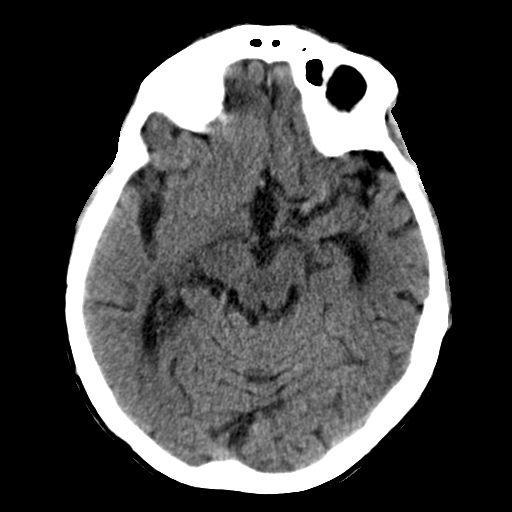
[im 11/31  bone]
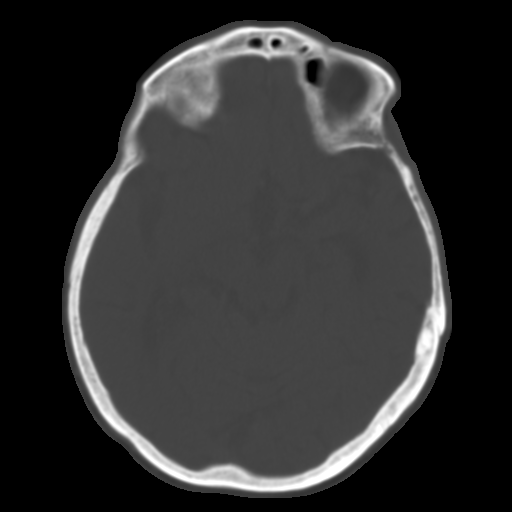
[im 13/31  brain]
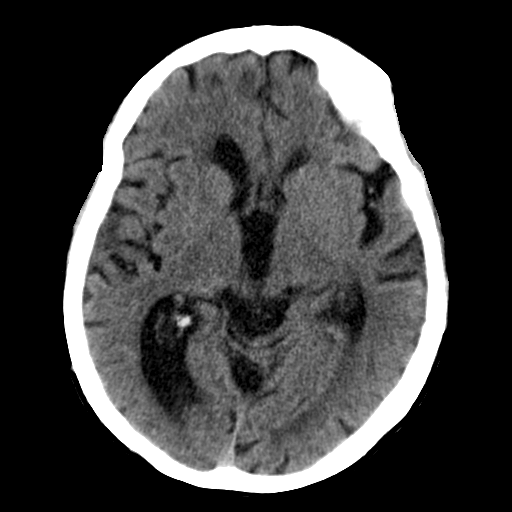
[im 16/31  brain]
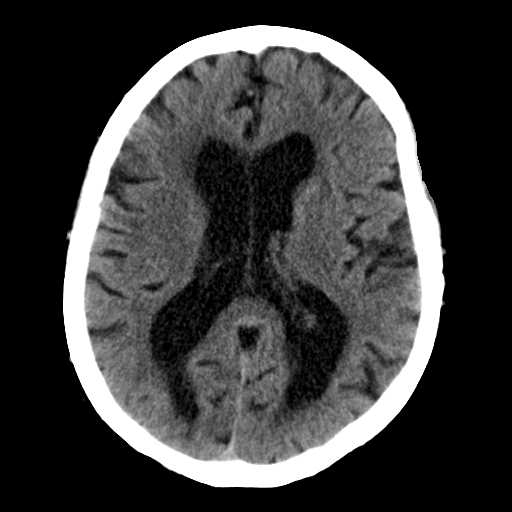
[im 18/31  brain]
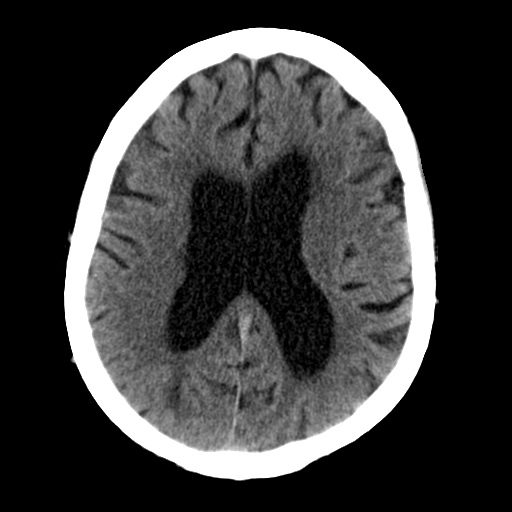
[im 20/31  brain]
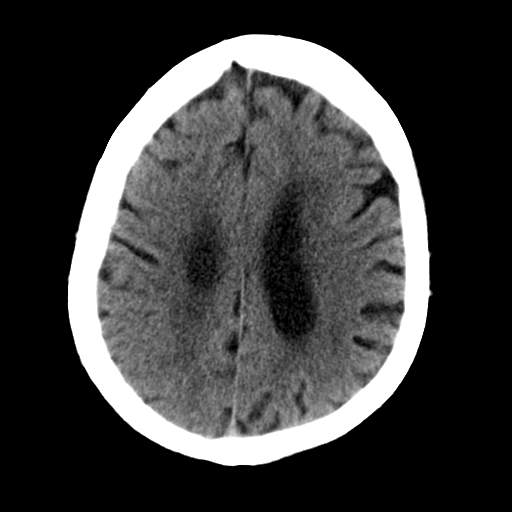
[im 20/31  bone]
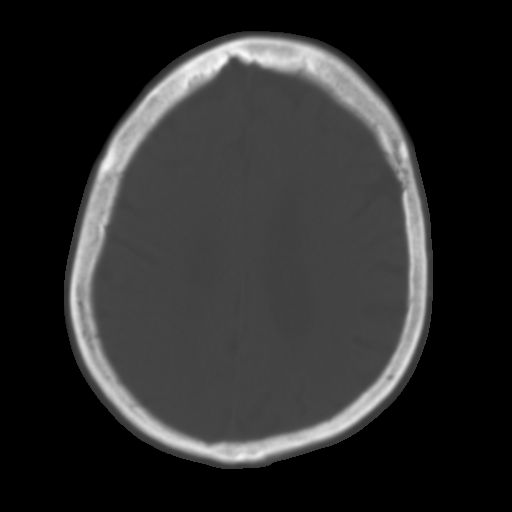
[im 22/31  brain]
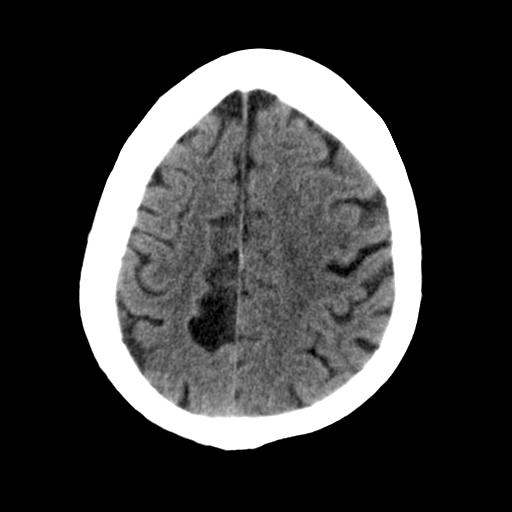
[im 24/31  brain]
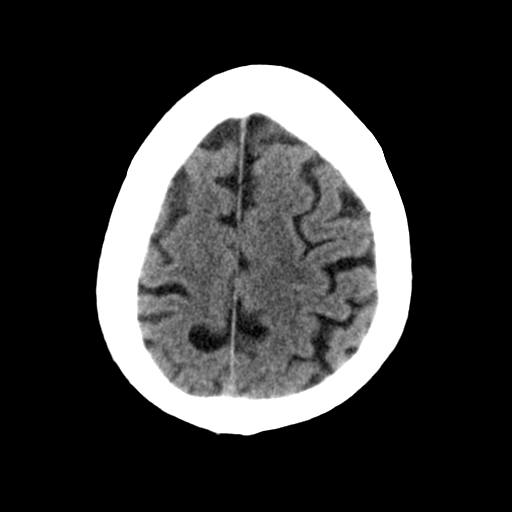
[im 26/31  brain]
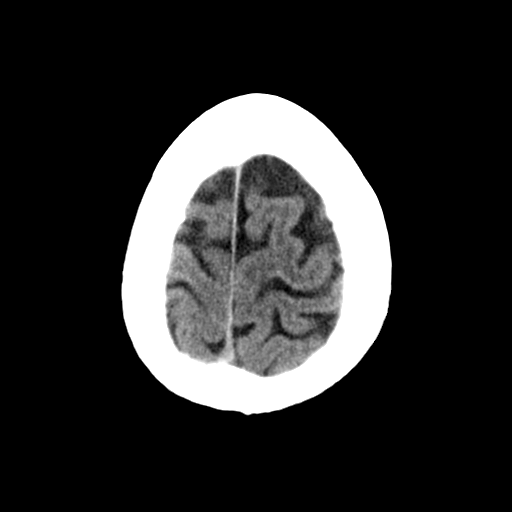
[im 28/31  brain]
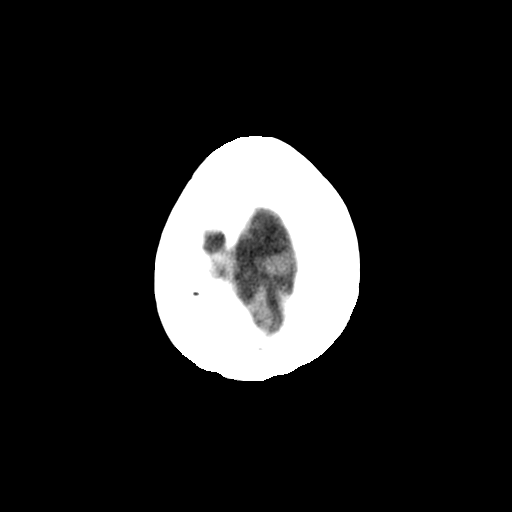
[im 28/31  bone]
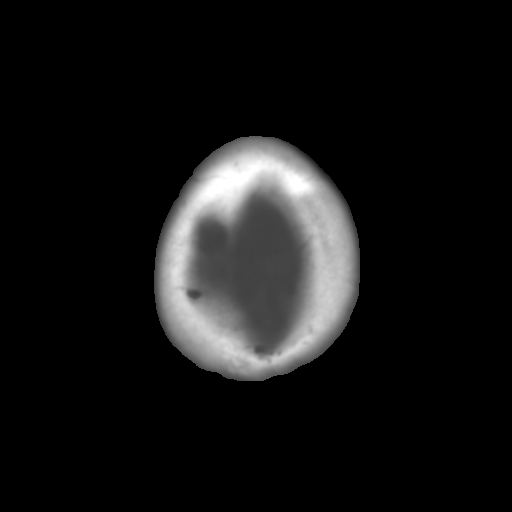

[Series 3: bone · axial · 0.39mm/px · z∈[+396,+416]mm · 2 of 31 slices shown]
[im 3/31  bone]
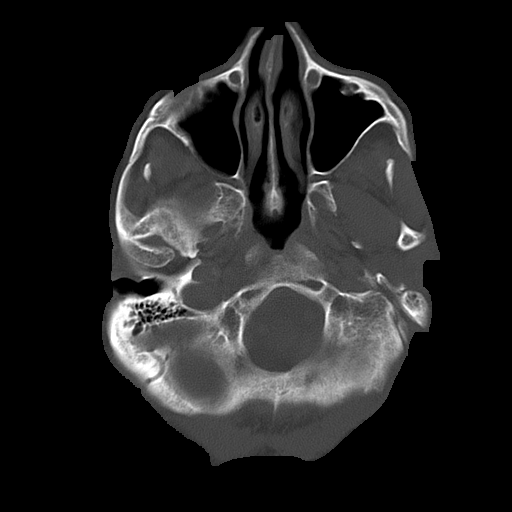
[im 7/31  bone]
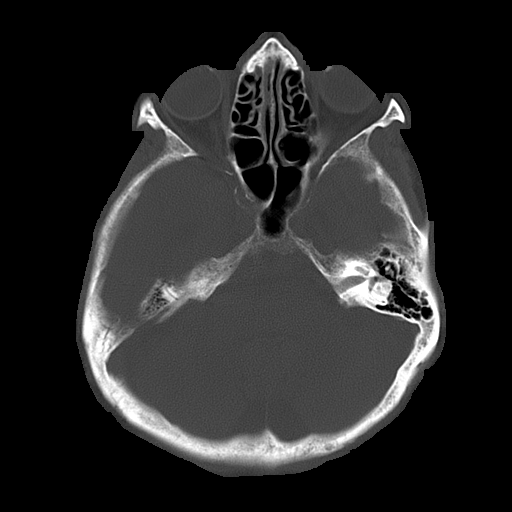

[15 of 30 positions shown; findings below may reference images not displayed]

PROCEDURE:     CT  - CT HEAD WITHOUT CONTRAST  - May 08, 2009  [DATE]

RESULT:     Axial noncontrast CT scanning was performed through the brain at
5 mm intervals and slice thicknesses. There are no prior brain CTs for
comparison.

There is mild diffuse cerebral and cerebellar atrophy with compensatory
ventriculomegaly. There is no shift of the midline. There is decreased
density in the deep white matter of both cerebral hemispheres consistent
with chronic small vessel ischemic type change. A small old lacunar
infarction along the anterior limb of the left internal capsule is present.
I do not see evidence of an acute ischemic infarction nor evidence of acute
intracranial hemorrhage.

At bone window settings the observed portions of the paranasal sinuses
exhibit no air-fluid levels. The mastoid air cells appear well pneumatized.
There is no evidence of an acute skull fracture.
IMPRESSION: I see no acute intracranial abnormality. There are chronic
changes that are age-appropriate.

## 2011-07-23 MED ORDER — LEVOTHYROXINE SODIUM 75 MCG PO TABS: 75.0000 ug | ORAL_TABLET | Freq: Every day | ORAL | Status: DC

## 2011-07-30 DIAGNOSIS — D485 Neoplasm of uncertain behavior of skin: Secondary | ICD-10-CM | POA: Diagnosis not present

## 2011-07-30 DIAGNOSIS — M793 Panniculitis, unspecified: Secondary | ICD-10-CM | POA: Diagnosis not present

## 2011-07-30 DIAGNOSIS — L988 Other specified disorders of the skin and subcutaneous tissue: Secondary | ICD-10-CM | POA: Diagnosis not present

## 2011-08-12 DIAGNOSIS — I10 Essential (primary) hypertension: Secondary | ICD-10-CM | POA: Diagnosis not present

## 2011-08-12 DIAGNOSIS — R0602 Shortness of breath: Secondary | ICD-10-CM | POA: Diagnosis not present

## 2011-08-12 DIAGNOSIS — E782 Mixed hyperlipidemia: Secondary | ICD-10-CM | POA: Diagnosis not present

## 2011-08-12 DIAGNOSIS — I059 Rheumatic mitral valve disease, unspecified: Secondary | ICD-10-CM | POA: Diagnosis not present

## 2011-08-24 DIAGNOSIS — H353290 Exudative age-related macular degeneration, unspecified eye, stage unspecified: Secondary | ICD-10-CM | POA: Diagnosis not present

## 2011-09-18 ENCOUNTER — Other Ambulatory Visit: Payer: Self-pay | Admitting: *Deleted

## 2011-09-18 MED ORDER — LOSARTAN POTASSIUM-HCTZ 100-25 MG PO TABS: 1.0000 | ORAL_TABLET | Freq: Every day | ORAL | Status: DC

## 2011-09-23 DIAGNOSIS — H3530 Unspecified macular degeneration: Secondary | ICD-10-CM | POA: Diagnosis not present

## 2011-09-28 ENCOUNTER — Encounter: Payer: Self-pay | Admitting: Internal Medicine

## 2011-09-28 ENCOUNTER — Ambulatory Visit (INDEPENDENT_AMBULATORY_CARE_PROVIDER_SITE_OTHER): Payer: Medicare Other | Admitting: Internal Medicine

## 2011-09-28 VITALS — BP 150/84 | HR 65 | Temp 97.4°F | Ht 62.0 in | Wt 125.0 lb

## 2011-09-28 DIAGNOSIS — I10 Essential (primary) hypertension: Secondary | ICD-10-CM

## 2011-09-28 DIAGNOSIS — M810 Age-related osteoporosis without current pathological fracture: Secondary | ICD-10-CM | POA: Diagnosis not present

## 2011-09-28 DIAGNOSIS — K219 Gastro-esophageal reflux disease without esophagitis: Secondary | ICD-10-CM | POA: Diagnosis not present

## 2011-09-28 DIAGNOSIS — E039 Hypothyroidism, unspecified: Secondary | ICD-10-CM

## 2011-09-28 NOTE — Progress Notes (Signed)
Subjective:    Patient ID: Stacey Blankenship, female    DOB: 05/10/1915, 78 y.o.   MRN: [id number]  HPI Here with aide [name]  Overall, doing well Has noted some brittleness to nails---has to file them so they don't grow too long Now they are too short and they chip  Cooks a little Goes out to [employer] much of the time though Still does instrumental ADLs Doesn't drive  Has some pain along low thoracic back in midline Has crease there and it hurts No skin lesions Aleve helps Still not doing any exercise  Has been tired at times Feels that she is panting after walking short distances No specific chest pain--does get strange feeling under sternum No dizziness or syncope  No recent heartburn Takes the omeprazole daily as far as she knows  Current Outpatient Prescriptions on File Prior to Visit  Medication Sig Dispense Refill  . aspirin 81 MG tablet Take 81 mg by mouth every other day.        . B Complex Vitamins (B COMPLEX 100 PO) Take 1 tablet by mouth daily.        . Calcium Carbonate-Vitamin D (CALCIUM-VITAMIN D3) 600-200 MG-UNIT TABS Take 1 tablet by mouth daily.        . Cholecalciferol (VITAMIN D) 1000 UNITS capsule Take 1,000 Units by mouth daily.        Marland Kitchen levothyroxine (SYNTHROID, LEVOTHROID) 75 MCG tablet Take 1 tablet (75 mcg total) by mouth daily.  30 tablet  8  . losartan-hydrochlorothiazide (HYZAAR) 100-25 MG per tablet Take 1 tablet by mouth daily.  30 tablet  11  . Multiple Vitamins-Minerals (PRESERVISION/LUTEIN PO) Take 1 capsule by mouth daily.        . multivitamin-iron-minerals-folic acid (THERAPEUTIC-M) TABS tablet Take 1 tablet by mouth daily.        . Omega-3 350 MG CAPS Take 1 capsule by mouth daily.        Marland Kitchen omeprazole (PRILOSEC) 20 MG capsule Take 1 capsule (20 mg total) by mouth daily.  30 capsule  11  . polyethylene glycol powder (MIRALAX) powder Take 17 g by mouth daily.  527 g  1  . vitamin B-12 (CYANOCOBALAMIN) 1000 MCG tablet Take 1,000  mcg by mouth daily.          No Known Allergies  Past Medical History  Diagnosis Date  . Allergy   . GERD (gastroesophageal reflux disease)   . Hyperlipidemia   . Hypertension   . Hyperthyroidism   . Osteoporosis   . IBS (irritable bowel syndrome)   . Bundle branch block   . Chronic venous insufficiency     Past Surgical History  Procedure Date  . Tonsillectomy and adenoidectomy   . Appendectomy 01/05/1918  . Pneumonia 01/05/1990  . Back surgery 01/05/2002  . Ablation saphenous vein w/ rfa 01/05/2006    No family history on file.  History   Social History  . Marital Status: Widowed    Spouse Name: N/A    Number of Children: 2  . Years of Education: N/A   Occupational History  . Retired Engineer, site    Social History Main Topics  . Smoking status: Former Smoker    Quit date: 02/18/2005  . Smokeless tobacco: Never Used  . Alcohol Use: Yes  . Drug Use: Not on file  . Sexually Active: Not on file   Other Topics Concern  . Not on file   Social History Narrative   Has living willDaughter  [name] is her health care POAHas DNR and reviewed this orderWould not want tube feeds if cognitively unaware   Review of Systems Sleeps okay Appetite is okay     Objective:   Physical Exam  Constitutional: She appears well-developed and well-nourished. No distress.  Neck: Normal range of motion. Neck supple. No thyromegaly present.  Cardiovascular: Normal rate, regular rhythm and normal heart sounds.  Exam reveals no gallop.   No murmur heard. Pulmonary/Chest: Effort normal and breath sounds normal. No respiratory distress. She has no wheezes. She has no rales.  Abdominal: Soft. There is no tenderness.  Musculoskeletal: She exhibits no edema.       Mild scoliosis to right No back tenderness  Lymphadenopathy:    She has no cervical adenopathy.  Psychiatric: She has a normal mood and affect. Her behavior is normal. Thought content normal.          Assessment &  Plan:

## 2011-09-28 NOTE — Assessment & Plan Note (Signed)
BP Readings from Last 3 Encounters:  09/28/11 150/84  03/27/11 140/80  08/11/10 130/88   Up slightly but no reason to increase meds at her age

## 2011-09-28 NOTE — Assessment & Plan Note (Signed)
Symptoms controlled with her med

## 2011-09-28 NOTE — Assessment & Plan Note (Signed)
Discussed regular weight bearing exercise Calcium and vitamin D

## 2011-09-28 NOTE — Assessment & Plan Note (Signed)
Seems euthyroid Will recheck labs given new problems with her nails

## 2011-09-30 ENCOUNTER — Ambulatory Visit: Payer: MEDICARE | Admitting: Internal Medicine

## 2011-10-05 ENCOUNTER — Encounter: Payer: Self-pay | Admitting: *Deleted

## 2011-10-09 DIAGNOSIS — H5710 Ocular pain, unspecified eye: Secondary | ICD-10-CM | POA: Diagnosis not present

## 2011-10-16 DIAGNOSIS — H5710 Ocular pain, unspecified eye: Secondary | ICD-10-CM | POA: Diagnosis not present

## 2011-10-26 DIAGNOSIS — H353290 Exudative age-related macular degeneration, unspecified eye, stage unspecified: Secondary | ICD-10-CM | POA: Diagnosis not present

## 2011-11-11 DIAGNOSIS — Z23 Encounter for immunization: Secondary | ICD-10-CM | POA: Diagnosis not present

## 2011-11-12 DIAGNOSIS — B351 Tinea unguium: Secondary | ICD-10-CM | POA: Diagnosis not present

## 2011-11-25 DIAGNOSIS — B351 Tinea unguium: Secondary | ICD-10-CM | POA: Diagnosis not present

## 2011-11-25 DIAGNOSIS — M79609 Pain in unspecified limb: Secondary | ICD-10-CM | POA: Diagnosis not present

## 2011-12-15 ENCOUNTER — Other Ambulatory Visit: Payer: Self-pay | Admitting: *Deleted

## 2011-12-15 MED ORDER — POLYETHYLENE GLYCOL 3350 17 GM/SCOOP PO POWD: 17.0000 g | Freq: Every day | ORAL | Status: DC

## 2011-12-21 DIAGNOSIS — H353290 Exudative age-related macular degeneration, unspecified eye, stage unspecified: Secondary | ICD-10-CM | POA: Diagnosis not present

## 2012-01-22 ENCOUNTER — Other Ambulatory Visit: Payer: Self-pay | Admitting: *Deleted

## 2012-01-22 MED ORDER — OMEPRAZOLE 20 MG PO CPDR: 20.0000 mg | DELAYED_RELEASE_CAPSULE | Freq: Every day | ORAL | Status: DC

## 2012-01-25 DIAGNOSIS — H40059 Ocular hypertension, unspecified eye: Secondary | ICD-10-CM | POA: Diagnosis not present

## 2012-01-27 DIAGNOSIS — H903 Sensorineural hearing loss, bilateral: Secondary | ICD-10-CM | POA: Diagnosis not present

## 2012-03-07 DIAGNOSIS — H353290 Exudative age-related macular degeneration, unspecified eye, stage unspecified: Secondary | ICD-10-CM | POA: Diagnosis not present

## 2012-03-23 DIAGNOSIS — L259 Unspecified contact dermatitis, unspecified cause: Secondary | ICD-10-CM | POA: Diagnosis not present

## 2012-03-28 ENCOUNTER — Ambulatory Visit: Payer: MEDICARE | Admitting: Internal Medicine

## 2012-03-28 DIAGNOSIS — Z0289 Encounter for other administrative examinations: Secondary | ICD-10-CM

## 2012-04-04 DIAGNOSIS — H4010X0 Unspecified open-angle glaucoma, stage unspecified: Secondary | ICD-10-CM | POA: Diagnosis not present

## 2012-04-18 ENCOUNTER — Other Ambulatory Visit: Payer: Self-pay | Admitting: *Deleted

## 2012-04-18 ENCOUNTER — Ambulatory Visit: Payer: MEDICARE | Admitting: Internal Medicine

## 2012-04-18 MED ORDER — LEVOTHYROXINE SODIUM 75 MCG PO TABS: 75.0000 ug | ORAL_TABLET | Freq: Every day | ORAL | Status: DC

## 2012-05-02 ENCOUNTER — Encounter: Payer: Self-pay | Admitting: Internal Medicine

## 2012-05-02 ENCOUNTER — Ambulatory Visit (INDEPENDENT_AMBULATORY_CARE_PROVIDER_SITE_OTHER): Payer: Medicare Other | Admitting: Internal Medicine

## 2012-05-02 VITALS — BP 140/80 | HR 72 | Temp 98.3°F | Wt 123.0 lb

## 2012-05-02 DIAGNOSIS — I10 Essential (primary) hypertension: Secondary | ICD-10-CM

## 2012-05-02 DIAGNOSIS — E039 Hypothyroidism, unspecified: Secondary | ICD-10-CM

## 2012-05-02 DIAGNOSIS — K219 Gastro-esophageal reflux disease without esophagitis: Secondary | ICD-10-CM | POA: Diagnosis not present

## 2012-05-02 DIAGNOSIS — M1990 Unspecified osteoarthritis, unspecified site: Secondary | ICD-10-CM

## 2012-05-02 LAB — CBC WITH DIFFERENTIAL/PLATELET
Eosinophils Relative: 0.5 % (ref 0.0–5.0)
HCT: 42.7 % (ref 36.0–46.0)
Lymphs Abs: 1 10*3/uL (ref 0.7–4.0)
MCV: 90.3 fl (ref 78.0–100.0)
Monocytes Absolute: 0.3 10*3/uL (ref 0.1–1.0)
Neutro Abs: 4.8 10*3/uL (ref 1.4–7.7)
Platelets: 136 10*3/uL — ABNORMAL LOW (ref 150.0–400.0)
RDW: 13 % (ref 11.5–14.6)
WBC: 6.1 10*3/uL (ref 4.5–10.5)

## 2012-05-02 LAB — BASIC METABOLIC PANEL
BUN: 27 mg/dL — ABNORMAL HIGH (ref 6–23)
Chloride: 98 mEq/L (ref 96–112)
Glucose, Bld: 97 mg/dL (ref 70–99)
Potassium: 3.3 mEq/L — ABNORMAL LOW (ref 3.5–5.1)

## 2012-05-02 LAB — HEPATIC FUNCTION PANEL
Alkaline Phosphatase: 58 U/L (ref 39–117)
Bilirubin, Direct: 0.1 mg/dL (ref 0.0–0.3)
Total Bilirubin: 0.8 mg/dL (ref 0.3–1.2)

## 2012-05-02 LAB — TSH: TSH: 1.35 u[IU]/mL (ref 0.35–5.50)

## 2012-05-02 NOTE — Assessment & Plan Note (Signed)
Stomach is fine Continues on omeprazole--needs it for gastroprotection also

## 2012-05-02 NOTE — Progress Notes (Signed)
Subjective:    Patient ID: Stacey Blankenship, female    DOB: 1915/05/23, 79 y.o.   MRN: [id number]  HPI Feels well Here with aide [name]--- just  Monday 1-4 and Wednesday 1-3 Stays busy [place] with cane Takes her to grocery and all appointments Has separate house cleaner also (weekly)  Eats at the [facility] a lot Has cereal and fruit for breakfast Goes out with friends also  Has ongoing back pain Uses aleve 2 in morning and occ 1 at night This really helps  No chest pain No SOB but does get some DOE if pushes too fast No dizziness or syncope  No heartburn Continues on omeprazole  Current Outpatient Prescriptions on File Prior to Visit  Medication Sig Dispense Refill  . aspirin 81 MG tablet Take 81 mg by mouth every other day.        . B Complex Vitamins (B COMPLEX 100 PO) Take 1 tablet by mouth daily.        . Calcium Carbonate-Vitamin D (CALCIUM-VITAMIN D3) 600-200 MG-UNIT TABS Take 1 tablet by mouth daily.        . Cholecalciferol (VITAMIN D) 1000 UNITS capsule Take 1,000 Units by mouth daily.        Marland Kitchen levothyroxine (SYNTHROID, LEVOTHROID) 75 MCG tablet Take 1 tablet (75 mcg total) by mouth daily.  30 tablet  6  . losartan-hydrochlorothiazide (HYZAAR) 100-25 MG per tablet Take 1 tablet by mouth daily.  30 tablet  11  . Multiple Vitamins-Minerals (PRESERVISION/LUTEIN PO) Take 1 capsule by mouth daily.        . multivitamin-iron-minerals-folic acid (THERAPEUTIC-M) TABS tablet Take 1 tablet by mouth daily.        . Omega-3 350 MG CAPS Take 1 capsule by mouth daily.        Marland Kitchen omeprazole (PRILOSEC) 20 MG capsule Take 1 capsule (20 mg total) by mouth daily.  30 capsule  6  . polyethylene glycol powder (MIRALAX) powder Take 17 g by mouth daily.  527 g  1  . vitamin B-12 (CYANOCOBALAMIN) 1000 MCG tablet Take 1,000 mcg by mouth daily.         No current facility-administered medications on file prior to visit.    No Known Allergies  Past Medical History  Diagnosis Date  .  Allergy   . GERD (gastroesophageal reflux disease)   . Hyperlipidemia   . Hypertension   . Hyperthyroidism   . Osteoporosis   . IBS (irritable bowel syndrome)   . Bundle branch block   . Chronic venous insufficiency     Past Surgical History  Procedure Laterality Date  . Tonsillectomy and adenoidectomy    . Appendectomy  01/05/1918  . Pneumonia  01/05/1990  . Back surgery  01/05/2002  . Ablation saphenous vein w/ rfa  01/05/2006    No family history on file.  History   Social History  . Marital Status: Widowed    Spouse Name: N/A    Number of Children: 2  . Years of Education: N/A   Occupational History  . Retired Engineer, site    Social History Main Topics  . Smoking status: Former Smoker    Quit date: 02/18/2005  . Smokeless tobacco: Never Used  . Alcohol Use: Yes  . Drug Use: Not on file  . Sexually Active: Not on file   Other Topics Concern  . Not on file   Social History Narrative   Has living will   Daughter [name] is her health care POA  Has DNR and reviewed this order   Would not want tube feeds if cognitively unaware   Review of Systems Sleeps well Appetite is fine---tries to be careful. Weight down 2# Bowels are fine with miralax     Objective:   Physical Exam  Constitutional: She appears well-developed and well-nourished. No distress.  Neck: Normal range of motion. Neck supple. No thyromegaly present.  Cardiovascular: Normal rate and regular rhythm.  Exam reveals no gallop.   Murmur heard. Gr 2/6 aortic systolic murmur  Pulmonary/Chest: Effort normal and breath sounds normal. No respiratory distress. She has no wheezes. She has no rales.  Abdominal: Soft. There is no tenderness.  Musculoskeletal: She exhibits no edema and no tenderness.  Lymphadenopathy:    She has no cervical adenopathy.  Psychiatric: She has a normal mood and affect. Her behavior is normal.          Assessment & Plan:

## 2012-05-02 NOTE — Assessment & Plan Note (Signed)
BP Readings from Last 3 Encounters:  05/02/12 140/80  09/28/11 150/84  03/27/11 140/80   Good control for age Due for labs

## 2012-05-02 NOTE — Assessment & Plan Note (Signed)
Euthyroid Will check labs 

## 2012-05-02 NOTE — Assessment & Plan Note (Signed)
Mostly in back Aleve does help Will check renal function

## 2012-05-05 ENCOUNTER — Encounter: Payer: Self-pay | Admitting: *Deleted

## 2012-05-23 DIAGNOSIS — H353190 Nonexudative age-related macular degeneration, unspecified eye, stage unspecified: Secondary | ICD-10-CM | POA: Diagnosis not present

## 2012-05-23 DIAGNOSIS — H353290 Exudative age-related macular degeneration, unspecified eye, stage unspecified: Secondary | ICD-10-CM | POA: Diagnosis not present

## 2012-05-27 ENCOUNTER — Other Ambulatory Visit: Payer: Medicare Other

## 2012-05-31 ENCOUNTER — Ambulatory Visit: Payer: Medicare Other | Admitting: Internal Medicine

## 2012-05-31 ENCOUNTER — Encounter: Payer: Self-pay | Admitting: Internal Medicine

## 2012-05-31 ENCOUNTER — Ambulatory Visit (INDEPENDENT_AMBULATORY_CARE_PROVIDER_SITE_OTHER): Payer: Medicare Other | Admitting: Internal Medicine

## 2012-05-31 VITALS — BP 120/70 | HR 77 | Temp 98.5°F | Wt 124.0 lb

## 2012-05-31 DIAGNOSIS — F0390 Unspecified dementia without behavioral disturbance: Secondary | ICD-10-CM | POA: Insufficient documentation

## 2012-05-31 DIAGNOSIS — R413 Other amnesia: Secondary | ICD-10-CM

## 2012-05-31 NOTE — Progress Notes (Signed)
  Subjective:    Patient ID: Stacey Blankenship, female    DOB: 1915-09-21, 79 y.o.   MRN: [id number]  HPI Here with daughter [name]  Review Angelica Chessman RNs emails Concerns about messing up meds Memory issues  Feels the staff at [facility] are sweet and have her interests in mind but "then I hear about things for 6 months" Feels [facility] "wasn't doing things right" Got confused with changes in generic meds and didn't recognize her meds  Review of Systems     Objective:   Physical Exam  Constitutional: She is oriented to person, place, and time.  Neurological: She is alert and oriented to person, place, and time.  Got date right ---wrong day in May though (said 24th) President-- "Obama, Bush, ?" [id number] Recall  1/3          Assessment & Plan:

## 2012-05-31 NOTE — Assessment & Plan Note (Signed)
Information gathering and couselling for most of 15 minute visit Does well functionally other than getting messed up with the meds Has aide twice a week and house cleaner once a week  Will decrease med burden Now will get through [employer]

## 2012-06-01 DIAGNOSIS — H4010X0 Unspecified open-angle glaucoma, stage unspecified: Secondary | ICD-10-CM | POA: Diagnosis not present

## 2012-06-06 DIAGNOSIS — D485 Neoplasm of uncertain behavior of skin: Secondary | ICD-10-CM | POA: Diagnosis not present

## 2012-06-06 DIAGNOSIS — L570 Actinic keratosis: Secondary | ICD-10-CM | POA: Diagnosis not present

## 2012-06-13 DIAGNOSIS — B351 Tinea unguium: Secondary | ICD-10-CM | POA: Diagnosis not present

## 2012-06-13 DIAGNOSIS — M79609 Pain in unspecified limb: Secondary | ICD-10-CM | POA: Diagnosis not present

## 2012-06-21 DIAGNOSIS — H10509 Unspecified blepharoconjunctivitis, unspecified eye: Secondary | ICD-10-CM | POA: Diagnosis not present

## 2012-07-20 DIAGNOSIS — H4010X0 Unspecified open-angle glaucoma, stage unspecified: Secondary | ICD-10-CM | POA: Diagnosis not present

## 2012-08-19 ENCOUNTER — Telehealth: Payer: Self-pay | Admitting: *Deleted

## 2012-08-19 NOTE — Telephone Encounter (Signed)
Stacey Blankenship called back and stated that [facility] medical supply doesn't have a form to fax over, but they need a office note within 30 days discussing the walker. I advised pt was last seen in May and will need a office visit to discuss the walker.

## 2012-08-19 NOTE — Telephone Encounter (Signed)
Spoke with Morrie Sheldon and she will let the patient know to call for an appt to discuss the walker

## 2012-08-19 NOTE — Telephone Encounter (Signed)
That is correct A shame since I know she uses it but generally don't make a particular note about it

## 2012-08-19 NOTE — Telephone Encounter (Signed)
Message copied by Sueanne Margarita on Fri Aug 19, 2012 12:11 PM ------      Message from: Tillman Abide I      Created: Fri Aug 19, 2012 11:31 AM      Regarding: RE: Durable Equipment Order Needed       Can you take care of this      Will need script written unless the form is the script      ----- Message -----         From: Buena Irish         Sent: 08/19/2012  10:50 AM           To: Karie Schwalbe, MD      Subject: Durable Equipment Order Needed                           Morrie Sheldon from [facility] called, [place] [employer] walker needs replacing.  Please place an order for Durable Med Equip.  She is also faxing over a form from [facility] that [place] stated that was needed as well.  Thank you.       ------

## 2012-08-22 DIAGNOSIS — H353290 Exudative age-related macular degeneration, unspecified eye, stage unspecified: Secondary | ICD-10-CM | POA: Diagnosis not present

## 2012-08-31 DIAGNOSIS — M79609 Pain in unspecified limb: Secondary | ICD-10-CM | POA: Diagnosis not present

## 2012-08-31 DIAGNOSIS — L84 Corns and callosities: Secondary | ICD-10-CM | POA: Diagnosis not present

## 2012-08-31 DIAGNOSIS — B351 Tinea unguium: Secondary | ICD-10-CM | POA: Diagnosis not present

## 2012-09-15 DIAGNOSIS — C44519 Basal cell carcinoma of skin of other part of trunk: Secondary | ICD-10-CM | POA: Diagnosis not present

## 2012-10-12 ENCOUNTER — Telehealth: Payer: Self-pay

## 2012-10-12 NOTE — Telephone Encounter (Signed)
Morrie Sheldon nurse at independent living at [facility]; Morrie Sheldon request an order faxed to [phone] for multivitamin (pt wants to take Multivitamin). Please advise.

## 2012-10-13 NOTE — Telephone Encounter (Signed)
Order written. Please fax

## 2012-10-17 NOTE — Telephone Encounter (Signed)
Order faxed and scanned.

## 2012-10-31 ENCOUNTER — Ambulatory Visit: Payer: Medicare Other | Admitting: Internal Medicine

## 2012-11-03 DIAGNOSIS — Z23 Encounter for immunization: Secondary | ICD-10-CM | POA: Diagnosis not present

## 2012-11-09 ENCOUNTER — Ambulatory Visit (INDEPENDENT_AMBULATORY_CARE_PROVIDER_SITE_OTHER): Payer: Medicare Other | Admitting: Podiatry

## 2012-11-09 ENCOUNTER — Encounter: Payer: Self-pay | Admitting: Podiatry

## 2012-11-09 VITALS — BP 145/90 | HR 67 | Resp 14 | Ht 62.0 in | Wt 120.0 lb

## 2012-11-09 DIAGNOSIS — B351 Tinea unguium: Secondary | ICD-10-CM | POA: Diagnosis not present

## 2012-11-09 DIAGNOSIS — M79609 Pain in unspecified limb: Secondary | ICD-10-CM

## 2012-11-09 NOTE — Progress Notes (Signed)
Ayodele presents today for routine nail debridement secondary to pain.  Objective: Vital signs are stable she is alert and oriented x3. Nails are thick yellow dystrophic clinically mycotic and painful palpation as well as debridement.  Assessment: Pain in limb secondary to onychomycosis 1 through 5 bilateral.  Plan: Debridement of nails 1 through 5 bilateral covered service secondary to pain. All of her 3 months.

## 2012-11-15 ENCOUNTER — Other Ambulatory Visit: Payer: Self-pay | Admitting: Internal Medicine

## 2012-11-16 DIAGNOSIS — H353290 Exudative age-related macular degeneration, unspecified eye, stage unspecified: Secondary | ICD-10-CM | POA: Diagnosis not present

## 2012-11-16 DIAGNOSIS — H4010X0 Unspecified open-angle glaucoma, stage unspecified: Secondary | ICD-10-CM | POA: Diagnosis not present

## 2012-11-21 DIAGNOSIS — H353290 Exudative age-related macular degeneration, unspecified eye, stage unspecified: Secondary | ICD-10-CM | POA: Diagnosis not present

## 2012-11-28 ENCOUNTER — Ambulatory Visit (INDEPENDENT_AMBULATORY_CARE_PROVIDER_SITE_OTHER): Payer: Medicare Other | Admitting: Internal Medicine

## 2012-11-28 ENCOUNTER — Encounter: Payer: Self-pay | Admitting: Internal Medicine

## 2012-11-28 VITALS — BP 140/80 | HR 69 | Temp 97.4°F | Wt 122.0 lb

## 2012-11-28 DIAGNOSIS — E039 Hypothyroidism, unspecified: Secondary | ICD-10-CM

## 2012-11-28 DIAGNOSIS — I10 Essential (primary) hypertension: Secondary | ICD-10-CM

## 2012-11-28 DIAGNOSIS — K219 Gastro-esophageal reflux disease without esophagitis: Secondary | ICD-10-CM

## 2012-11-28 DIAGNOSIS — R413 Other amnesia: Secondary | ICD-10-CM

## 2012-11-28 NOTE — Assessment & Plan Note (Signed)
Functionally doing well with the packaged meds Will try to wean down further Doing well on her own with only a little help

## 2012-11-28 NOTE — Progress Notes (Signed)
Subjective:    Patient ID: Stacey Blankenship, female    DOB: 1915-01-10, 79 y.o.   MRN: [id number]  HPI Here with aide, Pattricia Blankenship  Recent visit with Dr Monte Fantasia Things have stabilized with her macular degeneration--- for now the shots have been stopped  Has been getting meds through [facility] now Seems to be working fine now She is satisfied with the way this is working  Pattricia Blankenship comes 3 hours, 2 days per week--helps her grocery shop, etc Another comes another day to clean (weekly) Eats out in the dining rooms-- restaurants with a driving friend also She does the smaller meals  No heartburn No swallowing problems  Current Outpatient Prescriptions on File Prior to Visit  Medication Sig Dispense Refill  . aspirin 81 MG tablet Take 0.5 mg by mouth every other day.       . Cholecalciferol (VITAMIN D) 1000 UNITS capsule Take 1,000 Units by mouth daily.        Marland Kitchen levothyroxine (SYNTHROID, LEVOTHROID) 75 MCG tablet Take 1 tablet (75 mcg total) by mouth daily.  30 tablet  6  . losartan-hydrochlorothiazide (HYZAAR) 100-25 MG per tablet Take 1 tablet by mouth daily.  30 tablet  11  . Multiple Vitamin (THEREMS) TABS TAKE ONE TABLET BY MOUTH EACH DAY.  30 tablet  11  . Multiple Vitamins-Minerals (PRESERVISION AREDS 2) CAPS TAKE ONE GEL CAPSULE BY MOUTH EVERY DAY AFTER BREAKFAST (BROWN GEL CAP) AM DOSE IS IN 9AM SLOT  30 capsule  11  . Multiple Vitamins-Minerals (PRESERVISION/LUTEIN PO) Take 1 capsule by mouth daily.        . multivitamin-iron-minerals-folic acid (THERAPEUTIC-M) TABS tablet Take 1 tablet by mouth daily.        Marland Kitchen omeprazole (PRILOSEC) 20 MG capsule Take 1 capsule (20 mg total) by mouth daily.  30 capsule  6  . polyethylene glycol powder (MIRALAX) powder Take 17 g by mouth daily.  527 g  1   No current facility-administered medications on file prior to visit.    No Known Allergies  Past Medical History  Diagnosis Date  . Allergy   . GERD (gastroesophageal reflux disease)   .  Hyperlipidemia   . Hypertension   . Hyperthyroidism   . Osteoporosis   . IBS (irritable bowel syndrome)   . Bundle branch block   . Chronic venous insufficiency     Past Surgical History  Procedure Laterality Date  . Tonsillectomy and adenoidectomy    . Appendectomy  01/05/1918  . Pneumonia  01/05/1990  . Back surgery  01/05/2002  . Ablation saphenous vein w/ rfa  01/05/2006    No family history on file.  History   Social History  . Marital Status: Widowed    Spouse Name: N/A    Number of Children: 2  . Years of Education: N/A   Occupational History  . Retired Engineer, site    Social History Main Topics  . Smoking status: Former Smoker    Quit date: 02/18/2005  . Smokeless tobacco: Never Used  . Alcohol Use: Yes  . Drug Use: Not on file  . Sexual Activity: Not on file   Other Topics Concern  . Not on file   Social History Narrative   Has living will   Daughter Stacey Blankenship is her health care POA   Has DNR and reviewed this order   Would not want tube feeds if cognitively unaware   Review of Systems Had nails done by the foot doctor---she can't cut them Bruising a  lot---wonders about the ASA Bowels moving okay with the miralax Notes DOE if she pushes it walking fast    Objective:   Physical Exam  Constitutional: She appears well-developed and well-nourished. No distress.  Neck: Normal range of motion. Neck supple. No thyromegaly present.  Cardiovascular: Normal rate and regular rhythm.  Exam reveals no gallop.   Murmur heard. Gr 3/6 coarse aortic murmur  Pulmonary/Chest: Effort normal and breath sounds normal. No respiratory distress. She has no wheezes. She has no rales.  Abdominal: Soft. There is no tenderness.  Musculoskeletal: She exhibits no edema and no tenderness.  Lymphadenopathy:    She has no cervical adenopathy.  Psychiatric: She has a normal mood and affect. Her behavior is normal.          Assessment & Plan:

## 2012-11-28 NOTE — Assessment & Plan Note (Signed)
BP Readings from Last 3 Encounters:  11/28/12 140/80  11/09/12 145/90  05/31/12 120/70   Good control No changes needed

## 2012-11-28 NOTE — Patient Instructions (Signed)
Please stop the aspirin Change the omeprazole to 20mg  every other day for 2 weeks. If you don't have any problems with swallowing, heartburn or cough then---you can stop it.

## 2012-11-28 NOTE — Assessment & Plan Note (Signed)
Seems to be euthyroid Will check labs next time 

## 2012-11-28 NOTE — Progress Notes (Signed)
Pre-visit discussion using our clinic review tool. No additional management support is needed unless otherwise documented below in the visit note.  

## 2012-11-28 NOTE — Assessment & Plan Note (Signed)
No recent symptoms Will try to wean off

## 2012-11-30 ENCOUNTER — Other Ambulatory Visit: Payer: Self-pay | Admitting: Internal Medicine

## 2012-11-30 ENCOUNTER — Telehealth: Payer: Self-pay

## 2012-11-30 NOTE — Telephone Encounter (Signed)
Order faxed, pt's daughter notified

## 2012-11-30 NOTE — Telephone Encounter (Signed)
Order is in IN box  Please take off med list thanks

## 2012-11-30 NOTE — Telephone Encounter (Signed)
[name] pts daughter left v/m; pt was seen 11/28/12, [name] said Dr Alphonsus Sias said pt could discontinue thyroid medicine. [name] wants order sent to [facility] to discontinue thyroid med. Please advise.[facility] request cb also. Dr Alphonsus Sias will not return to office 12/05/12.

## 2012-12-14 ENCOUNTER — Other Ambulatory Visit: Payer: Self-pay | Admitting: Internal Medicine

## 2012-12-19 DIAGNOSIS — H353290 Exudative age-related macular degeneration, unspecified eye, stage unspecified: Secondary | ICD-10-CM | POA: Diagnosis not present

## 2013-01-11 ENCOUNTER — Other Ambulatory Visit: Payer: Self-pay | Admitting: Internal Medicine

## 2013-01-23 DIAGNOSIS — H353290 Exudative age-related macular degeneration, unspecified eye, stage unspecified: Secondary | ICD-10-CM | POA: Diagnosis not present

## 2013-02-01 ENCOUNTER — Ambulatory Visit: Payer: Medicare Other | Admitting: Podiatry

## 2013-02-13 DIAGNOSIS — L570 Actinic keratosis: Secondary | ICD-10-CM | POA: Diagnosis not present

## 2013-02-13 DIAGNOSIS — Z85828 Personal history of other malignant neoplasm of skin: Secondary | ICD-10-CM | POA: Diagnosis not present

## 2013-02-27 DIAGNOSIS — H3560 Retinal hemorrhage, unspecified eye: Secondary | ICD-10-CM | POA: Diagnosis not present

## 2013-02-27 DIAGNOSIS — H353290 Exudative age-related macular degeneration, unspecified eye, stage unspecified: Secondary | ICD-10-CM | POA: Diagnosis not present

## 2013-03-20 DIAGNOSIS — H4010X0 Unspecified open-angle glaucoma, stage unspecified: Secondary | ICD-10-CM | POA: Diagnosis not present

## 2013-03-27 DIAGNOSIS — H353290 Exudative age-related macular degeneration, unspecified eye, stage unspecified: Secondary | ICD-10-CM | POA: Diagnosis not present

## 2013-04-04 ENCOUNTER — Other Ambulatory Visit: Payer: Self-pay | Admitting: Internal Medicine

## 2013-05-01 ENCOUNTER — Other Ambulatory Visit: Payer: Self-pay | Admitting: Internal Medicine

## 2013-05-01 DIAGNOSIS — H353290 Exudative age-related macular degeneration, unspecified eye, stage unspecified: Secondary | ICD-10-CM | POA: Diagnosis not present

## 2013-05-17 DIAGNOSIS — H4010X0 Unspecified open-angle glaucoma, stage unspecified: Secondary | ICD-10-CM | POA: Diagnosis not present

## 2013-05-22 DIAGNOSIS — H353290 Exudative age-related macular degeneration, unspecified eye, stage unspecified: Secondary | ICD-10-CM | POA: Diagnosis not present

## 2013-05-23 DIAGNOSIS — H43819 Vitreous degeneration, unspecified eye: Secondary | ICD-10-CM | POA: Diagnosis not present

## 2013-05-31 DIAGNOSIS — I10 Essential (primary) hypertension: Secondary | ICD-10-CM | POA: Diagnosis not present

## 2013-05-31 DIAGNOSIS — R0602 Shortness of breath: Secondary | ICD-10-CM | POA: Diagnosis not present

## 2013-05-31 DIAGNOSIS — E785 Hyperlipidemia, unspecified: Secondary | ICD-10-CM | POA: Diagnosis not present

## 2013-05-31 DIAGNOSIS — I271 Kyphoscoliotic heart disease: Secondary | ICD-10-CM | POA: Diagnosis not present

## 2013-06-05 ENCOUNTER — Ambulatory Visit: Payer: Medicare Other | Admitting: Internal Medicine

## 2013-06-05 DIAGNOSIS — Z0289 Encounter for other administrative examinations: Secondary | ICD-10-CM

## 2013-06-13 DIAGNOSIS — R0602 Shortness of breath: Secondary | ICD-10-CM | POA: Diagnosis not present

## 2013-06-13 DIAGNOSIS — I10 Essential (primary) hypertension: Secondary | ICD-10-CM | POA: Diagnosis not present

## 2013-06-13 DIAGNOSIS — E785 Hyperlipidemia, unspecified: Secondary | ICD-10-CM | POA: Diagnosis not present

## 2013-06-13 DIAGNOSIS — I271 Kyphoscoliotic heart disease: Secondary | ICD-10-CM | POA: Diagnosis not present

## 2013-06-21 ENCOUNTER — Ambulatory Visit (INDEPENDENT_AMBULATORY_CARE_PROVIDER_SITE_OTHER): Payer: Medicare Other | Admitting: Podiatry

## 2013-06-21 VITALS — BP 151/88 | HR 69 | Resp 16

## 2013-06-21 DIAGNOSIS — M79609 Pain in unspecified limb: Secondary | ICD-10-CM

## 2013-06-21 DIAGNOSIS — B351 Tinea unguium: Secondary | ICD-10-CM

## 2013-06-21 NOTE — Progress Notes (Signed)
She presents today with a chief complaint of painful elongated toenails.  Objective: Nails are thick yellow dystrophic and mycotic and painful palpation pulses are palpable bilateral.  Assessment: Pain in limb secondary onychomycosis.  Plan: Debridement of nails 1 through 5 bilateral covered service secondary to pain.

## 2013-06-26 DIAGNOSIS — H353290 Exudative age-related macular degeneration, unspecified eye, stage unspecified: Secondary | ICD-10-CM | POA: Diagnosis not present

## 2013-07-03 DIAGNOSIS — D692 Other nonthrombocytopenic purpura: Secondary | ICD-10-CM | POA: Diagnosis not present

## 2013-07-17 ENCOUNTER — Ambulatory Visit: Payer: Medicare Other | Admitting: Internal Medicine

## 2013-07-20 ENCOUNTER — Non-Acute Institutional Stay: Payer: Medicare Other | Admitting: Internal Medicine

## 2013-07-20 ENCOUNTER — Encounter: Payer: Self-pay | Admitting: Internal Medicine

## 2013-07-20 VITALS — BP 120/60 | HR 66 | Temp 98.0°F | Resp 18 | Wt 122.0 lb

## 2013-07-20 DIAGNOSIS — M1990 Unspecified osteoarthritis, unspecified site: Secondary | ICD-10-CM | POA: Diagnosis not present

## 2013-07-20 DIAGNOSIS — F0390 Unspecified dementia without behavioral disturbance: Secondary | ICD-10-CM

## 2013-07-20 DIAGNOSIS — I10 Essential (primary) hypertension: Secondary | ICD-10-CM

## 2013-07-20 DIAGNOSIS — R32 Unspecified urinary incontinence: Secondary | ICD-10-CM

## 2013-07-20 DIAGNOSIS — K219 Gastro-esophageal reflux disease without esophagitis: Secondary | ICD-10-CM

## 2013-07-20 NOTE — Assessment & Plan Note (Signed)
Ongoing issue Wears protection

## 2013-07-20 NOTE — Assessment & Plan Note (Signed)
Mild  Has had slow decline and increased care needs Seems to be good fit in [place] Loose stool may be from nerves as she adjusts

## 2013-07-20 NOTE — Progress Notes (Signed)
Subjective:    Patient ID: Stacey Blankenship, female    DOB: 1915-02-08, 80 y.o.   MRN: [id number]  HPI Reviewed status with Katie RN  Seems to have adjusted here well Very happy with her apt---family really set it up well with her important family and pictures She gave me an extensive tour.  Walks with rollator Has remained independent with ADLs Ongoing urinary incontinence Some loose stools since yesterday---no fever or abdominal pain. Actually only 1 per day and not liquidy.  Continues on monthly shots for MD Drops also Vision appears to be stable  No chest pain Some DOE with walking--no major change No dizziness or syncope No edema of note  Takes aleve daily in AM Uses it in afternoon as needed Mostly for back pain No heartburn or swallowing problems on the omeprazole  Current Outpatient Prescriptions on File Prior to Visit  Medication Sig Dispense Refill  . aspirin EC 81 MG tablet TAKE ONE TABLET EVERY DAY AFTER BREAFKAST ON SUNDAY, MON., WED. AND FRIDAY ONLY (YELLOW #121) PLACE IN 9AM SLOT  22 tablet  11  . Cholecalciferol (VITAMIN D) 1000 UNITS capsule TAKE ONE GEL CAPSULE BY MOUTH EVERY DAY AFTER BREAKST (LIGHT YELLOW GEL CAP) PLACE IN 9AM SLOT  30 capsule  11  . losartan-hydrochlorothiazide (HYZAAR) 100-25 MG per tablet TAKE ONE TABLET BY MOUTH EVERY DAY AFTER BREAKFAST (YELLOW #7368) PLACE IN 9AM SLOT  30 tablet  5  . Multiple Vitamin (THEREMS) TABS TAKE ONE TABLET BY MOUTH EACH DAY.  30 tablet  11  . Multiple Vitamins-Minerals (PRESERVISION AREDS 2) CAPS TAKE ONE GEL CAPSULE BY MOUTH EVERY DAY AFTER BREAKFAST (BROWN GEL CAP) AM DOSE IS IN 9AM SLOT  30 capsule  11  . omeprazole (PRILOSEC) 20 MG capsule TAKE ONE CAPSULE BY MOUTH EVERY DAY BEFORE BREAKFAST (PURPLE/GRAY #R158)- PLACE IN 7AM SLOT  30 capsule  11  . polyethylene glycol powder (MIRALAX) powder Take 17 g by mouth daily.  527 g  1   No current facility-administered medications on file prior to visit.     No Known Allergies  Past Medical History  Diagnosis Date  . Allergy   . GERD (gastroesophageal reflux disease)   . Hyperlipidemia   . Hypertension   . Hyperthyroidism   . Osteoporosis   . IBS (irritable bowel syndrome)   . Bundle branch block   . Chronic venous insufficiency     Past Surgical History  Procedure Laterality Date  . Tonsillectomy and adenoidectomy    . Appendectomy  01/05/1918  . Pneumonia  01/05/1990  . Back surgery  01/05/2002  . Ablation saphenous vein w/ rfa  01/05/2006    No family history on file.  History   Social History  . Marital Status: Widowed    Spouse Name: N/A    Number of Children: 2  . Years of Education: N/A   Occupational History  . Retired Education officer, museum    Social History Main Topics  . Smoking status: Former Smoker    Quit date: 02/18/2005  . Smokeless tobacco: Never Used  . Alcohol Use: Yes  . Drug Use: Not on file  . Sexual Activity: Not on file   Other Topics Concern  . Not on file   Social History Narrative   Has living will   Daughter Stanton Kidney is her health care POA   Has DNR and reviewed this order   Would not want tube feeds if cognitively unaware   Review of Systems Appetite  is good Weight is stable Sleeping well in general    Objective:   Physical Exam  Constitutional: She appears well-developed. No distress.  Neck: Normal range of motion. Neck supple. No thyromegaly present.  Cardiovascular: Normal rate and regular rhythm.  Exam reveals no gallop.   Gr 3/6 systolic murmur  Pulmonary/Chest: Effort normal and breath sounds normal. No respiratory distress. She has no wheezes. She has no rales.  Abdominal: Soft. Bowel sounds are normal. She exhibits no distension. There is no tenderness. There is no rebound and no guarding.  Musculoskeletal: She exhibits no edema and no tenderness.  Lymphadenopathy:    She has no cervical adenopathy.  Neurological:  No focal weakness Walks stably with rollator Mild  confusion---introduced herself when I have been her doctor for some time  Skin: No rash noted.  Psychiatric: She has a normal mood and affect. Her behavior is normal.          Assessment & Plan:

## 2013-07-20 NOTE — Assessment & Plan Note (Signed)
BP Readings from Last 3 Encounters:  07/20/13 120/60  06/21/13 151/88  11/28/12 140/80   Good control No changes needed

## 2013-07-20 NOTE — Assessment & Plan Note (Signed)
Does well with 1 aleve daily PM dose prn On protective PPI

## 2013-07-20 NOTE — Assessment & Plan Note (Signed)
Controlled on omeprazole.   

## 2013-07-21 ENCOUNTER — Telehealth: Payer: Self-pay | Admitting: Internal Medicine

## 2013-07-21 NOTE — Telephone Encounter (Signed)
Relevant patient education mailed to patient.  

## 2013-08-01 DIAGNOSIS — M6281 Muscle weakness (generalized): Secondary | ICD-10-CM | POA: Diagnosis not present

## 2013-08-03 DIAGNOSIS — M6281 Muscle weakness (generalized): Secondary | ICD-10-CM | POA: Diagnosis not present

## 2013-08-04 DIAGNOSIS — M6281 Muscle weakness (generalized): Secondary | ICD-10-CM | POA: Diagnosis not present

## 2013-08-08 DIAGNOSIS — I10 Essential (primary) hypertension: Secondary | ICD-10-CM | POA: Diagnosis not present

## 2013-08-08 DIAGNOSIS — M6281 Muscle weakness (generalized): Secondary | ICD-10-CM | POA: Diagnosis not present

## 2013-08-09 DIAGNOSIS — M6281 Muscle weakness (generalized): Secondary | ICD-10-CM | POA: Diagnosis not present

## 2013-08-09 DIAGNOSIS — I10 Essential (primary) hypertension: Secondary | ICD-10-CM | POA: Diagnosis not present

## 2013-08-11 DIAGNOSIS — I10 Essential (primary) hypertension: Secondary | ICD-10-CM | POA: Diagnosis not present

## 2013-08-11 DIAGNOSIS — M6281 Muscle weakness (generalized): Secondary | ICD-10-CM | POA: Diagnosis not present

## 2013-08-23 DIAGNOSIS — H4010X0 Unspecified open-angle glaucoma, stage unspecified: Secondary | ICD-10-CM | POA: Diagnosis not present

## 2013-08-28 DIAGNOSIS — I10 Essential (primary) hypertension: Secondary | ICD-10-CM | POA: Diagnosis not present

## 2013-08-28 DIAGNOSIS — M6281 Muscle weakness (generalized): Secondary | ICD-10-CM | POA: Diagnosis not present

## 2013-08-28 DIAGNOSIS — H353290 Exudative age-related macular degeneration, unspecified eye, stage unspecified: Secondary | ICD-10-CM | POA: Diagnosis not present

## 2013-08-29 DIAGNOSIS — I10 Essential (primary) hypertension: Secondary | ICD-10-CM | POA: Diagnosis not present

## 2013-08-29 DIAGNOSIS — M6281 Muscle weakness (generalized): Secondary | ICD-10-CM | POA: Diagnosis not present

## 2013-08-31 DIAGNOSIS — M6281 Muscle weakness (generalized): Secondary | ICD-10-CM | POA: Diagnosis not present

## 2013-08-31 DIAGNOSIS — I10 Essential (primary) hypertension: Secondary | ICD-10-CM | POA: Diagnosis not present

## 2013-09-04 DIAGNOSIS — I10 Essential (primary) hypertension: Secondary | ICD-10-CM | POA: Diagnosis not present

## 2013-09-04 DIAGNOSIS — M6281 Muscle weakness (generalized): Secondary | ICD-10-CM | POA: Diagnosis not present

## 2013-09-05 DIAGNOSIS — M6281 Muscle weakness (generalized): Secondary | ICD-10-CM | POA: Diagnosis not present

## 2013-09-07 DIAGNOSIS — M6281 Muscle weakness (generalized): Secondary | ICD-10-CM | POA: Diagnosis not present

## 2013-09-12 DIAGNOSIS — M6281 Muscle weakness (generalized): Secondary | ICD-10-CM | POA: Diagnosis not present

## 2013-09-15 ENCOUNTER — Non-Acute Institutional Stay: Payer: Medicare Other | Admitting: Internal Medicine

## 2013-09-15 ENCOUNTER — Encounter: Payer: Self-pay | Admitting: Internal Medicine

## 2013-09-15 VITALS — BP 130/80 | HR 74 | Temp 97.7°F | Resp 16

## 2013-09-15 DIAGNOSIS — I38 Endocarditis, valve unspecified: Secondary | ICD-10-CM | POA: Diagnosis not present

## 2013-09-15 DIAGNOSIS — R0602 Shortness of breath: Secondary | ICD-10-CM

## 2013-09-15 DIAGNOSIS — R609 Edema, unspecified: Secondary | ICD-10-CM

## 2013-09-15 DIAGNOSIS — M6281 Muscle weakness (generalized): Secondary | ICD-10-CM | POA: Diagnosis not present

## 2013-09-15 NOTE — Progress Notes (Signed)
Subjective:    Patient ID: Stacey Blankenship, female    DOB: [birth year], 80 y.o.   MRN: [id number]  HPI  Asked to evaluate resident by RN for increased peripheral edema and shortness of breath. This has gotten worse over the last week or so. She thinks it may be related to her diet. She is consuming more salt at this facility than the was at home. She reports the swelling is not painful. It is not interfering with her ADL's. She has not been wearing her TED hose as she should. Her SOB does not occur at rest. It only occurs with long distances. She is currently working with PT. She denies chest pain, cough or wheezing. She has been on lasix in the past but does not tolerate it very well. Per RN report, her family does not want her on any additional diuretic unless this becomes a comfort issue.  Review of Systems      Past Medical History  Diagnosis Date  . Allergy   . GERD (gastroesophageal reflux disease)   . Hyperlipidemia   . Hypertension   . Hyperthyroidism   . Osteoporosis   . IBS (irritable bowel syndrome)   . Bundle branch block   . Chronic venous insufficiency     Current Outpatient Prescriptions  Medication Sig Dispense Refill  . aspirin EC 81 MG tablet TAKE ONE TABLET EVERY DAY AFTER BREAFKAST ON SUNDAY, MON., WED. AND FRIDAY ONLY (YELLOW #121) PLACE IN 9AM SLOT  22 tablet  11  . Cholecalciferol (VITAMIN D) 1000 UNITS capsule TAKE ONE GEL CAPSULE BY MOUTH EVERY DAY AFTER BREAKST (LIGHT YELLOW GEL CAP) PLACE IN 9AM SLOT  30 capsule  11  . losartan-hydrochlorothiazide (HYZAAR) 100-25 MG per tablet TAKE ONE TABLET BY MOUTH EVERY DAY AFTER BREAKFAST (YELLOW #7368) PLACE IN 9AM SLOT  30 tablet  5  . Multiple Vitamin (THEREMS) TABS TAKE ONE TABLET BY MOUTH EACH DAY.  30 tablet  11  . Multiple Vitamins-Minerals (PRESERVISION AREDS 2) CAPS TAKE ONE GEL CAPSULE BY MOUTH EVERY DAY AFTER BREAKFAST (BROWN GEL CAP) AM DOSE IS IN 9AM SLOT  30 capsule  11  . naproxen sodium (ANAPROX) 220  MG tablet Take 220 mg by mouth every morning. And 1 in evening prn      . omeprazole (PRILOSEC) 20 MG capsule TAKE ONE CAPSULE BY MOUTH EVERY DAY BEFORE BREAKFAST (PURPLE/GRAY #R158)- PLACE IN 7AM SLOT  30 capsule  11  . polyethylene glycol powder (MIRALAX) powder Take 17 g by mouth daily.  527 g  1  . timolol (BETIMOL) 0.5 % ophthalmic solution Place 1 drop into both eyes daily.      . travoprost, benzalkonium, (TRAVATAN) 0.004 % ophthalmic solution Place 1 drop into both eyes at bedtime.       No current facility-administered medications for this visit.    No Known Allergies  History reviewed. No pertinent family history.  History   Social History  . Marital Status: Widowed    Spouse Name: N/A    Number of Children: 2  . Years of Education: N/A   Occupational History  . Retired Education officer, museum    Social History Main Topics  . Smoking status: Former Smoker    Quit date: 02/18/2005  . Smokeless tobacco: Never Used  . Alcohol Use: Yes  . Drug Use: Not on file  . Sexual Activity: Not on file   Other Topics Concern  . Not on file   Social History Narrative  Has living will   Daughter Stanton Kidney is her health care POA   Has DNR and reviewed this order   Would not want tube feeds if cognitively unaware     Constitutional: Denies fever, malaise, fatigue, headache or abrupt weight changes.  Respiratory: Pt reports DOE. Denies difficulty breathing, cough or sputum production.   Cardiovascular: Pt reports swelling in the hands or feet. Denies chest pain, chest tightness, palpitations or swelling in the hands   No other specific complaints in a complete review of systems (except as listed in HPI above).  Objective:   Physical Exam   BP 130/80  Pulse 74  Temp(Src) 97.7 F (36.5 C)  Resp 16 Wt Readings from Last 3 Encounters:  07/20/13 122 lb (55.339 kg)  11/28/12 122 lb (55.339 kg)  11/09/12 120 lb (54.432 kg)    General: Appears her stated age, well developed, well  nourished in NAD. Cardiovascular: Normal rate and rhythm. S1,S2 noted.  Murmur noted. No rubs or gallops noted. 2+ pitting edema R>L. Pulmonary/Chest: Normal effort and positive vesicular breath sounds. Fine crackles noted in the RLL. No respiratory distress. No wheezes, or ronchi noted.   BMET    Component Value Date/Time   NA 136 05/02/2012 1503   K 3.3* 05/02/2012 1503   CL 98 05/02/2012 1503   CO2 29 05/02/2012 1503   GLUCOSE 97 05/02/2012 1503   BUN 27* 05/02/2012 1503   CREATININE 0.8 05/02/2012 1503   CALCIUM 9.0 05/02/2012 1503   GFRNONAA 74.42 12/09/2009 1107    Lipid Panel  No results found for this basename: chol, trig, hdl, cholhdl, vldl, ldlcalc    CBC    Component Value Date/Time   WBC 6.1 05/02/2012 1503   RBC 4.73 05/02/2012 1503   HGB 14.6 05/02/2012 1503   HCT 42.7 05/02/2012 1503   PLT 136.0* 05/02/2012 1503   MCV 90.3 05/02/2012 1503   MCHC 34.2 05/02/2012 1503   RDW 13.0 05/02/2012 1503   LYMPHSABS 1.0 05/02/2012 1503   MONOABS 0.3 05/02/2012 1503   EOSABS 0.0 05/02/2012 1503   BASOSABS 0.0 05/02/2012 1503    Hgb A1C No results found for this basename: HGBA1C        Assessment & Plan:   Shortness of breath and peripheral edema:  She does have valvular insufficiency- follows with Dr. Gigi Gin I would advise her to continue her HCTZ at the current dose Make sure she is wearing her TED hose daily- and keep legs elevated We will order a low sodium diet to see if this helps  If problems persist or worsen, notify Leafy Ro she will let us know

## 2013-09-15 NOTE — Patient Instructions (Signed)

## 2013-09-18 DIAGNOSIS — M79609 Pain in unspecified limb: Secondary | ICD-10-CM | POA: Diagnosis not present

## 2013-09-18 DIAGNOSIS — M7989 Other specified soft tissue disorders: Secondary | ICD-10-CM | POA: Diagnosis not present

## 2013-09-19 DIAGNOSIS — I10 Essential (primary) hypertension: Secondary | ICD-10-CM | POA: Diagnosis not present

## 2013-09-19 DIAGNOSIS — E785 Hyperlipidemia, unspecified: Secondary | ICD-10-CM | POA: Diagnosis not present

## 2013-09-19 DIAGNOSIS — R0602 Shortness of breath: Secondary | ICD-10-CM | POA: Diagnosis not present

## 2013-09-19 DIAGNOSIS — I271 Kyphoscoliotic heart disease: Secondary | ICD-10-CM | POA: Diagnosis not present

## 2013-09-20 ENCOUNTER — Ambulatory Visit: Payer: Medicare Other | Admitting: Podiatry

## 2013-09-25 DIAGNOSIS — R609 Edema, unspecified: Secondary | ICD-10-CM | POA: Diagnosis not present

## 2013-09-25 DIAGNOSIS — R0602 Shortness of breath: Secondary | ICD-10-CM | POA: Diagnosis not present

## 2013-09-25 DIAGNOSIS — I10 Essential (primary) hypertension: Secondary | ICD-10-CM | POA: Diagnosis not present

## 2013-09-25 DIAGNOSIS — E785 Hyperlipidemia, unspecified: Secondary | ICD-10-CM | POA: Diagnosis not present

## 2013-10-11 DIAGNOSIS — H3532 Exudative age-related macular degeneration: Secondary | ICD-10-CM | POA: Diagnosis not present

## 2013-10-20 ENCOUNTER — Other Ambulatory Visit: Payer: Self-pay

## 2013-10-20 MED ORDER — POLYETHYLENE GLYCOL 3350 17 GM/SCOOP PO POWD: 17.0000 g | Freq: Every day | ORAL | Status: AC

## 2013-10-26 DIAGNOSIS — Z23 Encounter for immunization: Secondary | ICD-10-CM | POA: Diagnosis not present

## 2013-11-07 ENCOUNTER — Other Ambulatory Visit: Payer: Self-pay | Admitting: Internal Medicine

## 2013-11-08 ENCOUNTER — Ambulatory Visit: Payer: Medicare Other | Admitting: Podiatry

## 2013-11-09 ENCOUNTER — Non-Acute Institutional Stay: Payer: Medicare Other | Admitting: Internal Medicine

## 2013-11-09 ENCOUNTER — Encounter: Payer: Self-pay | Admitting: Internal Medicine

## 2013-11-09 VITALS — BP 126/82 | HR 58 | Temp 98.3°F | Resp 20 | Wt 122.0 lb

## 2013-11-09 DIAGNOSIS — I5022 Chronic systolic (congestive) heart failure: Secondary | ICD-10-CM | POA: Insufficient documentation

## 2013-11-09 DIAGNOSIS — I272 Pulmonary hypertension, unspecified: Secondary | ICD-10-CM

## 2013-11-09 DIAGNOSIS — F0390 Unspecified dementia without behavioral disturbance: Secondary | ICD-10-CM

## 2013-11-09 DIAGNOSIS — I10 Essential (primary) hypertension: Secondary | ICD-10-CM | POA: Diagnosis not present

## 2013-11-09 DIAGNOSIS — I270 Primary pulmonary hypertension: Secondary | ICD-10-CM

## 2013-11-09 NOTE — Assessment & Plan Note (Signed)
Mild and seems to be stable No reports of sig change per staff

## 2013-11-09 NOTE — Progress Notes (Signed)
Subjective:    Patient ID: Stacey Blankenship, female    DOB: Jan 18, 1915, 80 y.o.   MRN: [id number]  HPI Reviewed status with Leafy Ro RN  She feels she is doing okay Does have limitations due to dyspnea Sporadically uses her furosemide Will get dyspneic if she walks too fast Aide helps with shower No chest pain No palpitations No dizziness or syncope Edema is better--she keeps her feet up  Has adjusted here well No apparent decline in cognition  No heartburn No dysphagia Bowels are fine  Current Outpatient Prescriptions on File Prior to Visit  Medication Sig Dispense Refill  . aspirin EC 81 MG tablet TAKE ONE TABLET EVERY DAY AFTER BREAFKAST ON SUNDAY, MON., WED. AND FRIDAY ONLY (YELLOW #121) PLACE IN 9AM SLOT 22 tablet 11  . Cholecalciferol (VITAMIN D) 1000 UNITS capsule TAKE ONE GEL CAPSULE BY MOUTH EVERY DAY AFTER BREAKST (LIGHT YELLOW GEL CAP) PLACE IN 9AM SLOT 30 capsule 11  . losartan-hydrochlorothiazide (HYZAAR) 100-25 MG per tablet TAKE ONE TABLET BY MOUTH EVERY DAY AFTER BREAKFAST (YELLOW #7368) 30 tablet 11  . Multiple Vitamin (THEREMS) TABS TAKE ONE TABLET BY MOUTH EACH DAY. 30 tablet 11  . Multiple Vitamins-Minerals (PRESERVISION AREDS 2) CAPS TAKE ONE GEL CAPSULE BY MOUTH EVERY DAY AFTER BREAKFAST (BROWN GEL CAP) 30 capsule 11  . naproxen sodium (ANAPROX) 220 MG tablet Take 220 mg by mouth every morning. And 1 in evening prn    . omeprazole (PRILOSEC) 20 MG capsule TAKE ONE CAPSULE BY MOUTH EVERY DAY BEFORE BREAKFAST 30 capsule 11  . polyethylene glycol powder (MIRALAX) powder Take 17 g by mouth daily. 527 g 1  . timolol (BETIMOL) 0.5 % ophthalmic solution Place 1 drop into both eyes daily.    . travoprost, benzalkonium, (TRAVATAN) 0.004 % ophthalmic solution Place 1 drop into both eyes at bedtime.     No current facility-administered medications on file prior to visit.    No Known Allergies  Past Medical History  Diagnosis Date  . Allergy   . GERD  (gastroesophageal reflux disease)   . Hyperlipidemia   . Hypertension   . Hyperthyroidism   . Osteoporosis   . IBS (irritable bowel syndrome)   . Bundle branch block   . Chronic venous insufficiency     Past Surgical History  Procedure Laterality Date  . Tonsillectomy and adenoidectomy    . Appendectomy  01/05/1918  . Pneumonia  01/05/1990  . Back surgery  01/05/2002  . Ablation saphenous vein w/ rfa  01/05/2006    No family history on file.  History   Social History  . Marital Status: Widowed    Spouse Name: N/A    Number of Children: 2  . Years of Education: N/A   Occupational History  . Retired Education officer, museum    Social History Main Topics  . Smoking status: Former Smoker    Quit date: 02/18/2005  . Smokeless tobacco: Never Used  . Alcohol Use: Yes  . Drug Use: Not on file  . Sexual Activity: Not on file   Other Topics Concern  . Not on file   Social History Narrative   Has living will   Daughter Stanton Kidney is her health care POA   Has DNR and reviewed this order   Would not want tube feeds if cognitively unaware    Review of Systems Sleeps well. 1 pillow. No PND Appetite is fine    Objective:   Physical Exam  Constitutional: She appears well-developed and  well-nourished. No distress.  Neck: Normal range of motion. Neck supple.  Cardiovascular: Normal rate and regular rhythm.  Exam reveals no gallop.   Coarse aortic systolic murmur  Pulmonary/Chest: Effort normal. No respiratory distress. She has no wheezes.  Bibasilar crackles  Abdominal: Soft. She exhibits no distension. There is no tenderness.  Musculoskeletal: She exhibits no tenderness.  Trace tense swelling of calves--no pitting  Lymphadenopathy:    She has no cervical adenopathy.  Psychiatric: She has a normal mood and affect. Her behavior is normal.          Assessment & Plan:

## 2013-11-09 NOTE — Assessment & Plan Note (Signed)
Now with stable DOE--- NYHA Class 2 Fluid status better with occasional furosemide

## 2013-11-09 NOTE — Assessment & Plan Note (Signed)
Maintained on prn furosemide Stable status

## 2013-11-09 NOTE — Assessment & Plan Note (Signed)
BP Readings from Last 3 Encounters:  11/09/13 126/82  09/15/13 130/80  07/20/13 120/60   Good control

## 2013-11-20 ENCOUNTER — Ambulatory Visit (INDEPENDENT_AMBULATORY_CARE_PROVIDER_SITE_OTHER): Payer: Medicare Other | Admitting: Podiatry

## 2013-11-20 DIAGNOSIS — B351 Tinea unguium: Secondary | ICD-10-CM

## 2013-11-20 DIAGNOSIS — M79676 Pain in unspecified toe(s): Secondary | ICD-10-CM | POA: Diagnosis not present

## 2013-11-20 NOTE — Progress Notes (Signed)
She presents today with a chief complaint of painful elongated toenails.  Objective: Nails are thick yellow dystrophic and mycotic and painful palpation pulses are palpable bilateral.  Assessment: Pain in limb secondary onychomycosis.  Plan: Debridement of nails 1 through 5 bilateral covered service secondary to pain.

## 2013-11-24 DIAGNOSIS — H3532 Exudative age-related macular degeneration: Secondary | ICD-10-CM | POA: Diagnosis not present

## 2013-11-26 ENCOUNTER — Inpatient Hospital Stay: Payer: Self-pay | Admitting: Internal Medicine

## 2013-11-26 DIAGNOSIS — I5020 Unspecified systolic (congestive) heart failure: Secondary | ICD-10-CM | POA: Diagnosis not present

## 2013-11-26 DIAGNOSIS — E860 Dehydration: Secondary | ICD-10-CM | POA: Diagnosis not present

## 2013-11-26 DIAGNOSIS — J9601 Acute respiratory failure with hypoxia: Secondary | ICD-10-CM | POA: Diagnosis not present

## 2013-11-26 DIAGNOSIS — F0390 Unspecified dementia without behavioral disturbance: Secondary | ICD-10-CM | POA: Diagnosis present

## 2013-11-26 DIAGNOSIS — I5021 Acute systolic (congestive) heart failure: Secondary | ICD-10-CM | POA: Diagnosis not present

## 2013-11-26 DIAGNOSIS — H3530 Unspecified macular degeneration: Secondary | ICD-10-CM | POA: Diagnosis present

## 2013-11-26 DIAGNOSIS — J188 Other pneumonia, unspecified organism: Secondary | ICD-10-CM | POA: Diagnosis not present

## 2013-11-26 DIAGNOSIS — K219 Gastro-esophageal reflux disease without esophagitis: Secondary | ICD-10-CM | POA: Diagnosis present

## 2013-11-26 DIAGNOSIS — I214 Non-ST elevation (NSTEMI) myocardial infarction: Secondary | ICD-10-CM | POA: Diagnosis not present

## 2013-11-26 DIAGNOSIS — Z66 Do not resuscitate: Secondary | ICD-10-CM | POA: Diagnosis present

## 2013-11-26 DIAGNOSIS — I10 Essential (primary) hypertension: Secondary | ICD-10-CM | POA: Diagnosis not present

## 2013-11-26 DIAGNOSIS — J189 Pneumonia, unspecified organism: Secondary | ICD-10-CM | POA: Diagnosis not present

## 2013-11-26 DIAGNOSIS — M6281 Muscle weakness (generalized): Secondary | ICD-10-CM | POA: Diagnosis not present

## 2013-11-26 DIAGNOSIS — R0602 Shortness of breath: Secondary | ICD-10-CM | POA: Diagnosis not present

## 2013-11-26 DIAGNOSIS — Z7982 Long term (current) use of aspirin: Secondary | ICD-10-CM | POA: Diagnosis not present

## 2013-11-26 DIAGNOSIS — I5023 Acute on chronic systolic (congestive) heart failure: Secondary | ICD-10-CM | POA: Diagnosis not present

## 2013-11-26 DIAGNOSIS — Z823 Family history of stroke: Secondary | ICD-10-CM | POA: Diagnosis not present

## 2013-11-26 DIAGNOSIS — J181 Lobar pneumonia, unspecified organism: Secondary | ICD-10-CM | POA: Diagnosis not present

## 2013-11-26 DIAGNOSIS — A000 Cholera due to Vibrio cholerae 01, biovar cholerae: Secondary | ICD-10-CM | POA: Diagnosis not present

## 2013-11-26 DIAGNOSIS — I5031 Acute diastolic (congestive) heart failure: Secondary | ICD-10-CM | POA: Diagnosis not present

## 2013-11-26 DIAGNOSIS — J984 Other disorders of lung: Secondary | ICD-10-CM | POA: Diagnosis not present

## 2013-11-26 DIAGNOSIS — Z9889 Other specified postprocedural states: Secondary | ICD-10-CM | POA: Diagnosis not present

## 2013-11-26 DIAGNOSIS — R748 Abnormal levels of other serum enzymes: Secondary | ICD-10-CM | POA: Diagnosis present

## 2013-11-26 DIAGNOSIS — Z79899 Other long term (current) drug therapy: Secondary | ICD-10-CM | POA: Diagnosis not present

## 2013-11-26 DIAGNOSIS — R531 Weakness: Secondary | ICD-10-CM | POA: Diagnosis not present

## 2013-11-26 DIAGNOSIS — E876 Hypokalemia: Secondary | ICD-10-CM | POA: Diagnosis not present

## 2013-11-26 DIAGNOSIS — I509 Heart failure, unspecified: Secondary | ICD-10-CM | POA: Diagnosis not present

## 2013-11-26 LAB — CBC
HCT: 41.6 % (ref 35.0–47.0)
HGB: 13.7 g/dL (ref 12.0–16.0)
MCH: 30 pg (ref 26.0–34.0)
MCHC: 33 g/dL (ref 32.0–36.0)
MCV: 91 fL (ref 80–100)
Platelet: 134 10*3/uL — ABNORMAL LOW (ref 150–440)
RBC: 4.58 10*6/uL (ref 3.80–5.20)
RDW: 13.3 % (ref 11.5–14.5)
WBC: 10.8 10*3/uL (ref 3.6–11.0)

## 2013-11-26 LAB — COMPREHENSIVE METABOLIC PANEL
Albumin: 3.4 g/dL (ref 3.4–5.0)
Alkaline Phosphatase: 80 U/L
Anion Gap: 7 (ref 7–16)
BUN: 26 mg/dL — ABNORMAL HIGH (ref 7–18)
Bilirubin,Total: 1 mg/dL (ref 0.2–1.0)
Calcium, Total: 8.3 mg/dL — ABNORMAL LOW (ref 8.5–10.1)
Chloride: 101 mmol/L (ref 98–107)
Co2: 31 mmol/L (ref 21–32)
Creatinine: 0.8 mg/dL (ref 0.60–1.30)
EGFR (African American): >60
GLUCOSE: 104 mg/dL — AB (ref 65–99)
OSMOLALITY: 283 (ref 275–301)
POTASSIUM: 3.4 mmol/L — AB (ref 3.5–5.1)
SGOT(AST): 27 U/L (ref 15–37)
SGPT (ALT): 23 U/L
Sodium: 139 mmol/L (ref 136–145)
Total Protein: 7.3 g/dL (ref 6.4–8.2)

## 2013-11-26 LAB — MAGNESIUM: Magnesium: 1.9 mg/dL

## 2013-11-26 LAB — URINALYSIS, COMPLETE
BLOOD: NEGATIVE
Bacteria: NONE SEEN
Bilirubin,UR: NEGATIVE
GLUCOSE, UR: NEGATIVE mg/dL (ref 0–75)
Leukocyte Esterase: NEGATIVE
Nitrite: NEGATIVE
PROTEIN: NEGATIVE
Ph: 7 (ref 4.5–8.0)
RBC,UR: 3 /HPF (ref 0–5)
SPECIFIC GRAVITY: 1.009 (ref 1.003–1.030)
Squamous Epithelial: NONE SEEN
WBC UR: 1 /HPF (ref 0–5)

## 2013-11-26 LAB — TROPONIN I
TROPONIN-I: 0.09 ng/mL — AB
Troponin-I: 0.1 ng/mL — ABNORMAL HIGH

## 2013-11-26 LAB — PROTIME-INR
INR: 1.1
PROTHROMBIN TIME: 14.5 s (ref 11.5–14.7)

## 2013-11-26 LAB — PRO B NATRIURETIC PEPTIDE: B-Type Natriuretic Peptide: 6397 pg/mL — ABNORMAL HIGH (ref 0–450)

## 2013-11-26 LAB — APTT: ACTIVATED PTT: 37.6 s — AB (ref 23.6–35.9)

## 2013-11-27 LAB — BASIC METABOLIC PANEL
ANION GAP: 8 (ref 7–16)
BUN: 24 mg/dL — AB (ref 7–18)
CALCIUM: 8.1 mg/dL — AB (ref 8.5–10.1)
CHLORIDE: 97 mmol/L — AB (ref 98–107)
CO2: 32 mmol/L (ref 21–32)
CREATININE: 0.9 mg/dL (ref 0.60–1.30)
Glucose: 126 mg/dL — ABNORMAL HIGH (ref 65–99)
Osmolality: 279 (ref 275–301)
POTASSIUM: 3.3 mmol/L — AB (ref 3.5–5.1)
SODIUM: 137 mmol/L (ref 136–145)

## 2013-11-27 LAB — CBC WITH DIFFERENTIAL/PLATELET
Basophil #: 0 10*3/uL (ref 0.0–0.1)
Basophil %: 0.2 %
Eosinophil #: 0 10*3/uL (ref 0.0–0.7)
Eosinophil %: 0.1 %
HCT: 39.9 % (ref 35.0–47.0)
HGB: 13.3 g/dL (ref 12.0–16.0)
Lymphocyte #: 0.6 10*3/uL — ABNORMAL LOW (ref 1.0–3.6)
Lymphocyte %: 4.7 %
MCH: 29.9 pg (ref 26.0–34.0)
MCHC: 33.3 g/dL (ref 32.0–36.0)
MCV: 90 fL (ref 80–100)
MONOS PCT: 6.6 %
Monocyte #: 0.8 x10 3/mm (ref 0.2–0.9)
NEUTROS ABS: 11.1 10*3/uL — AB (ref 1.4–6.5)
Neutrophil %: 88.4 %
Platelet: 136 10*3/uL — ABNORMAL LOW (ref 150–440)
RBC: 4.45 10*6/uL (ref 3.80–5.20)
RDW: 13.5 % (ref 11.5–14.5)
WBC: 12.5 10*3/uL — ABNORMAL HIGH (ref 3.6–11.0)

## 2013-11-27 LAB — TROPONIN I: Troponin-I: 0.1 ng/mL — ABNORMAL HIGH

## 2013-11-27 LAB — MAGNESIUM: MAGNESIUM: 1.6 mg/dL — AB

## 2013-11-28 LAB — BASIC METABOLIC PANEL
Anion Gap: 8 (ref 7–16)
BUN: 42 mg/dL — AB (ref 7–18)
CALCIUM: 8.3 mg/dL — AB (ref 8.5–10.1)
CREATININE: 1 mg/dL (ref 0.60–1.30)
Chloride: 100 mmol/L (ref 98–107)
Co2: 32 mmol/L (ref 21–32)
EGFR (African American): >60
EGFR (Non-African Amer.): 55 — ABNORMAL LOW
Glucose: 110 mg/dL — ABNORMAL HIGH (ref 65–99)
Osmolality: 291 (ref 275–301)
POTASSIUM: 3.3 mmol/L — AB (ref 3.5–5.1)
Sodium: 140 mmol/L (ref 136–145)

## 2013-11-28 LAB — MAGNESIUM: MAGNESIUM: 2.4 mg/dL

## 2013-11-29 DIAGNOSIS — J188 Other pneumonia, unspecified organism: Secondary | ICD-10-CM | POA: Diagnosis not present

## 2013-11-29 DIAGNOSIS — J189 Pneumonia, unspecified organism: Secondary | ICD-10-CM | POA: Diagnosis not present

## 2013-11-29 DIAGNOSIS — I270 Primary pulmonary hypertension: Secondary | ICD-10-CM | POA: Diagnosis not present

## 2013-11-29 DIAGNOSIS — H3530 Unspecified macular degeneration: Secondary | ICD-10-CM | POA: Diagnosis not present

## 2013-11-29 DIAGNOSIS — K219 Gastro-esophageal reflux disease without esophagitis: Secondary | ICD-10-CM | POA: Diagnosis not present

## 2013-11-29 DIAGNOSIS — M6281 Muscle weakness (generalized): Secondary | ICD-10-CM | POA: Diagnosis not present

## 2013-11-29 DIAGNOSIS — R531 Weakness: Secondary | ICD-10-CM | POA: Diagnosis not present

## 2013-11-29 DIAGNOSIS — I5023 Acute on chronic systolic (congestive) heart failure: Secondary | ICD-10-CM | POA: Diagnosis not present

## 2013-11-29 DIAGNOSIS — E876 Hypokalemia: Secondary | ICD-10-CM | POA: Diagnosis not present

## 2013-11-29 DIAGNOSIS — I5022 Chronic systolic (congestive) heart failure: Secondary | ICD-10-CM | POA: Diagnosis not present

## 2013-11-29 DIAGNOSIS — I10 Essential (primary) hypertension: Secondary | ICD-10-CM | POA: Diagnosis not present

## 2013-11-29 DIAGNOSIS — I5020 Unspecified systolic (congestive) heart failure: Secondary | ICD-10-CM | POA: Diagnosis not present

## 2013-11-29 DIAGNOSIS — J181 Lobar pneumonia, unspecified organism: Secondary | ICD-10-CM | POA: Diagnosis not present

## 2013-11-29 DIAGNOSIS — E860 Dehydration: Secondary | ICD-10-CM | POA: Diagnosis not present

## 2013-11-29 DIAGNOSIS — I5031 Acute diastolic (congestive) heart failure: Secondary | ICD-10-CM | POA: Diagnosis not present

## 2013-11-29 DIAGNOSIS — F0390 Unspecified dementia without behavioral disturbance: Secondary | ICD-10-CM | POA: Diagnosis not present

## 2013-11-30 DIAGNOSIS — E876 Hypokalemia: Secondary | ICD-10-CM

## 2013-11-30 DIAGNOSIS — R531 Weakness: Secondary | ICD-10-CM

## 2013-11-30 DIAGNOSIS — J189 Pneumonia, unspecified organism: Secondary | ICD-10-CM | POA: Diagnosis not present

## 2013-11-30 DIAGNOSIS — I5020 Unspecified systolic (congestive) heart failure: Secondary | ICD-10-CM

## 2013-12-01 LAB — CULTURE, BLOOD (SINGLE)

## 2013-12-05 DIAGNOSIS — F0390 Unspecified dementia without behavioral disturbance: Secondary | ICD-10-CM | POA: Diagnosis not present

## 2013-12-05 DIAGNOSIS — I5022 Chronic systolic (congestive) heart failure: Secondary | ICD-10-CM

## 2013-12-05 DIAGNOSIS — I270 Primary pulmonary hypertension: Secondary | ICD-10-CM

## 2013-12-05 DIAGNOSIS — J189 Pneumonia, unspecified organism: Secondary | ICD-10-CM

## 2013-12-12 ENCOUNTER — Telehealth: Payer: Self-pay | Admitting: Internal Medicine

## 2013-12-12 NOTE — Telephone Encounter (Signed)
done

## 2013-12-12 NOTE — Telephone Encounter (Signed)
Leafy Ro is asking for you to call her about patient's medications.  Dr.Letvak signed form for patient's medications for [facility],but Leafy Ro has found a few medications that needed to be added and one needs to be corrected. She'll call [facility] and have them shred the form Dr.Letvak signed.  Form is on your desk.  Please call [name].

## 2013-12-25 DIAGNOSIS — I4891 Unspecified atrial fibrillation: Secondary | ICD-10-CM | POA: Diagnosis not present

## 2014-01-01 ENCOUNTER — Encounter: Payer: Self-pay | Admitting: Internal Medicine

## 2014-01-09 DIAGNOSIS — I10 Essential (primary) hypertension: Secondary | ICD-10-CM | POA: Diagnosis not present

## 2014-01-09 DIAGNOSIS — R0602 Shortness of breath: Secondary | ICD-10-CM | POA: Diagnosis not present

## 2014-01-09 DIAGNOSIS — E782 Mixed hyperlipidemia: Secondary | ICD-10-CM | POA: Diagnosis not present

## 2014-01-10 DIAGNOSIS — Z961 Presence of intraocular lens: Secondary | ICD-10-CM | POA: Diagnosis not present

## 2014-01-11 DIAGNOSIS — Z79899 Other long term (current) drug therapy: Secondary | ICD-10-CM | POA: Diagnosis not present

## 2014-01-12 DIAGNOSIS — H3532 Exudative age-related macular degeneration: Secondary | ICD-10-CM | POA: Diagnosis not present

## 2014-01-17 ENCOUNTER — Telehealth: Payer: Self-pay | Admitting: Internal Medicine

## 2014-01-17 NOTE — Telephone Encounter (Signed)
Reviewed cardiology note and message from [facility] Ongoing dyspnea, chest pain and likely ischemia Beta blocker cut back due to low blood pressure but episodes continue No further cardiac intervention planned. No nitrates due to AS and hypotension.  Will add low dose alprazolam. Consider roxanol for prn but will likely need to move to health care if ongoing spells

## 2014-01-18 ENCOUNTER — Encounter: Payer: Self-pay | Admitting: Internal Medicine

## 2014-01-19 ENCOUNTER — Telehealth: Payer: Self-pay

## 2014-01-19 NOTE — Telephone Encounter (Signed)
Stacey Blankenship with [facility] left v/m requesting cb with oral certification of terminal illness. Pt is at [place] request consult on 01/25/14.

## 2014-01-20 NOTE — Telephone Encounter (Signed)
Yes, I have already given verbal confirmation for hospice to [facility] Let them know

## 2014-01-22 NOTE — Telephone Encounter (Signed)
You can send my most recent note but they don't reflect the recent decline seen at her assisted living by the RN and other staff

## 2014-01-22 NOTE — Telephone Encounter (Signed)
Spoke with Stacey Blankenship and she didn't know about the verbal confirmation, also they need most current clinical notes faxed to [phone], please advise

## 2014-01-23 ENCOUNTER — Ambulatory Visit (INDEPENDENT_AMBULATORY_CARE_PROVIDER_SITE_OTHER): Payer: Medicare Other | Admitting: Family Medicine

## 2014-01-23 ENCOUNTER — Telehealth: Payer: Self-pay

## 2014-01-23 ENCOUNTER — Encounter: Payer: Self-pay | Admitting: Family Medicine

## 2014-01-23 VITALS — BP 144/90 | HR 74 | Temp 97.4°F | Wt 119.8 lb

## 2014-01-23 DIAGNOSIS — I5022 Chronic systolic (congestive) heart failure: Secondary | ICD-10-CM

## 2014-01-23 DIAGNOSIS — F411 Generalized anxiety disorder: Secondary | ICD-10-CM

## 2014-01-23 DIAGNOSIS — I272 Pulmonary hypertension, unspecified: Secondary | ICD-10-CM

## 2014-01-23 DIAGNOSIS — F0390 Unspecified dementia without behavioral disturbance: Secondary | ICD-10-CM | POA: Diagnosis not present

## 2014-01-23 DIAGNOSIS — I270 Primary pulmonary hypertension: Secondary | ICD-10-CM | POA: Diagnosis not present

## 2014-01-23 MED ORDER — ALPRAZOLAM 0.25 MG PO TABS: ORAL_TABLET | ORAL | Status: DC

## 2014-01-23 NOTE — Progress Notes (Signed)
Subjective:   Patient ID: Stacey Blankenship, female    DOB: [birth year], 81 y.o.   MRN: [id number]  Stacey Blankenship is a pleasant 81 y.o. year old female pt, new to me with complicated medical history, who presents to clinic today with her son Stacey Blankenship, to [place]  on 01/23/2014  HPI: Stacey Ro, RN contacted me prior to this visit. She explained to me that Stacey Blankenship, who has CHF with recent progression of symptoms (followed by Dr. Nehemiah Massed), started hospice last week.  She has been having more SOB and anxiety related to her SOB. She is on lasix.  Also wears compression hose.  Dr. Silvio Pate started her on xanax 0.125 mg as needed.  Stacey Blankenship feels that by the time she remembers to ask for this, her symptoms are very severe and difficult to control.  She did take one last night and sleep "very well."  Dr. Silvio Pate last saw her in ALF on 11/09/13- note reviewed.  Current Outpatient Prescriptions on File Prior to Visit  Medication Sig Dispense Refill  . aspirin EC 81 MG tablet TAKE ONE TABLET EVERY DAY AFTER BREAFKAST ON SUNDAY, MON., WED. AND FRIDAY ONLY (YELLOW #121) PLACE IN 9AM SLOT 22 tablet 11  . carvedilol (COREG) 3.125 MG tablet Take 3.125 mg by mouth 2 (two) times daily with a meal.    . Cholecalciferol (VITAMIN D) 1000 UNITS capsule TAKE ONE GEL CAPSULE BY MOUTH EVERY DAY AFTER BREAKST (LIGHT YELLOW GEL CAP) PLACE IN 9AM SLOT 30 capsule 11  . furosemide (LASIX) 20 MG tablet Take 20 mg by mouth daily as needed.    Marland Kitchen losartan-hydrochlorothiazide (HYZAAR) 100-25 MG per tablet TAKE ONE TABLET BY MOUTH EVERY DAY AFTER BREAKFAST (YELLOW #7368) 30 tablet 11  . Multiple Vitamin (THEREMS) TABS TAKE ONE TABLET BY MOUTH EACH DAY. 30 tablet 11  . Multiple Vitamins-Minerals (PRESERVISION AREDS 2) CAPS TAKE ONE GEL CAPSULE BY MOUTH EVERY DAY AFTER BREAKFAST (BROWN GEL CAP) 30 capsule 11  . naproxen sodium (ANAPROX) 220 MG tablet Take 220 mg by mouth every morning. And 1 in evening prn    .  omeprazole (PRILOSEC) 20 MG capsule TAKE ONE CAPSULE BY MOUTH EVERY DAY BEFORE BREAKFAST 30 capsule 11  . polyethylene glycol powder (MIRALAX) powder Take 17 g by mouth daily. 527 g 1  . timolol (BETIMOL) 0.5 % ophthalmic solution Place 1 drop into both eyes daily.    . travoprost, benzalkonium, (TRAVATAN) 0.004 % ophthalmic solution Place 1 drop into both eyes at bedtime.     No current facility-administered medications on file prior to visit.    No Known Allergies  Past Medical History  Diagnosis Date  . Allergy   . GERD (gastroesophageal reflux disease)   . Hyperlipidemia   . Hypertension   . Hyperthyroidism   . Osteoporosis   . IBS (irritable bowel syndrome)   . Bundle branch block   . Chronic venous insufficiency   . Systolic CHF, chronic     severe MR  . Pulmonary hypertension     Past Surgical History  Procedure Laterality Date  . Tonsillectomy and adenoidectomy    . Appendectomy  01/05/1918  . Pneumonia  01/05/1990  . Back surgery  01/05/2002  . Ablation saphenous vein w/ rfa  01/05/2006    No family history on file.  History   Social History  . Marital Status: Widowed    Spouse Name: N/A    Number of Children: 2  . Years of Education:  N/A   Occupational History  . Retired Education officer, museum    Social History Main Topics  . Smoking status: Former Smoker    Quit date: 02/18/2005  . Smokeless tobacco: Never Used  . Alcohol Use: Yes  . Drug Use: Not on file  . Sexual Activity: Not on file   Other Topics Concern  . Not on file   Social History Narrative   Has living will   Daughter Stanton Kidney is her health care POA   Has DNR and reviewed this order   Would not want tube feeds if cognitively unaware   The PMH, PSH, Social History, Family History, Medications, and allergies have been reviewed in [facility], and have been updated if relevant.   Review of Systems  Constitutional: Negative.   HENT: Negative.   Respiratory: Positive for shortness of breath.     Cardiovascular: Negative for chest pain.  Gastrointestinal: Negative.   Musculoskeletal: Negative.   Skin: Negative.   Allergic/Immunologic: Negative.   Neurological: Negative.   Hematological: Negative.   Psychiatric/Behavioral: Positive for sleep disturbance. Negative for suicidal ideas and self-injury. The patient is nervous/anxious.   All other systems reviewed and are negative.      Objective:    BP 144/90 mmHg  Pulse 74  Temp(Src) 97.4 F (36.3 C) (Oral)  Wt 119 lb 12 oz (54.318 kg)  SpO2 96% BP Readings from Last 3 Encounters:  01/23/14 144/90  11/09/13 126/82  09/15/13 130/80   Wt Readings from Last 3 Encounters:  01/23/14 119 lb 12 oz (54.318 kg)  11/09/13 122 lb (55.339 kg)  07/20/13 122 lb (55.339 kg)     Physical Exam  Constitutional: She is oriented to person, place, and time. She appears well-developed and well-nourished. No distress.  HENT:  Head: Normocephalic.  Eyes: Conjunctivae are normal.  Cardiovascular:  Murmur heard. Pulmonary/Chest: Effort normal. No respiratory distress. She has no wheezes.  Abdominal: Soft.  Musculoskeletal:  Trace LE edema bilaterally  Neurological: She is alert and oriented to person, place, and time. No cranial nerve deficit.  Skin: Skin is warm and dry.  Psychiatric: She has a normal mood and affect. Her behavior is normal. Judgment and thought content normal.  Nursing note and vitals reviewed.         Assessment & Plan:   No diagnosis found. No Follow-up on file.

## 2014-01-23 NOTE — Telephone Encounter (Signed)
[facility] left v/m; Hilda Blades is going to fax order about Dr Deborra Medina being the attending doctor for hospice. Debra request order signed and faxed back to [place].

## 2014-01-23 NOTE — Patient Instructions (Signed)
It was very nice to meet you. We will be in touch over next week or two.

## 2014-01-23 NOTE — Progress Notes (Signed)
Pre visit review using our clinic review tool, if applicable. No additional management support is needed unless otherwise documented below in the visit note. 

## 2014-01-23 NOTE — Telephone Encounter (Signed)
I faxed 07/20/13 and 09/15/13 office notes to [place] at [facility].

## 2014-01-23 NOTE — Assessment & Plan Note (Signed)
>  30 minutes spent in face to face time with patient and her son, >50% spent in counselling or coordination of care. Will schedule xanax 0.25 mg qhs, continue as needed 0.125 mg xanax dosage. We discussed sedation and addiction potential and I discussed this with Leafy Ro, RN as well. [name] and or her son will call me in 1 week with an update.

## 2014-01-26 NOTE — Telephone Encounter (Signed)
Signed and placed in my box

## 2014-01-30 ENCOUNTER — Telehealth: Payer: Self-pay | Admitting: Family Medicine

## 2014-01-30 NOTE — Telephone Encounter (Addendum)
I then called pt's on, Clair Gulling, back and spoke with him.  He is very concerned about her current state of anxiety and fear of being alone. I discussed the above with him and he agrees with current plan.

## 2014-01-30 NOTE — Telephone Encounter (Signed)
Called ALF nurse at [facility], Stacey Blankenship to discuss. Stacey Blankenship says that she is more symptomatic with her CHF- more shortness of breath which understandably worsening her anxiety.  The staff is working more with her and not having severe panic attacks during the day.  At night, she does wake up short of breath and in panic.  Stacey Blankenship feels she is anxious about being alone.  We did schedule xanax 0.25 mg qhs when she established care with me last week and initially, [name] felt this helped significantly.  She is having new panic attacks at 4 or 5 am now past few nights.  Prn xanax does calm her down faster than used to prior to having scheduled xanax.  Hospice will be coming in this week.  She talked with her daughter and is getting a LCSW to help her with some psychotherapy and breathing techniques/mental health support.

## 2014-01-30 NOTE — Telephone Encounter (Signed)
Pt's son, Clair Gulling, is calling very concerned for his mother. Clair Gulling states his mother suffered 3 panic attacks yesterday and he feels like it is getting worse. Clair Gulling would like a cb from Dr Deborra Medina to discuss her current situation. Please advise! Thank you

## 2014-02-01 ENCOUNTER — Telehealth: Payer: Self-pay | Admitting: Family Medicine

## 2014-02-01 MED ORDER — ALPRAZOLAM 0.25 MG PO TABS: ORAL_TABLET | ORAL | Status: DC

## 2014-02-01 MED ORDER — ALPRAZOLAM 0.5 MG PO TABS: ORAL_TABLET | ORAL | Status: DC

## 2014-02-01 NOTE — Telephone Encounter (Signed)
Orders faxed to [facility] as requested

## 2014-02-01 NOTE — Telephone Encounter (Signed)
Received email from Leticia Penna, [place] at [facility] stating that she needed two prescriptions- one for scheduled dose of xanax and one for prn dose.  Rxs printed.  Please fax to [facility].

## 2014-02-02 DIAGNOSIS — M1990 Unspecified osteoarthritis, unspecified site: Secondary | ICD-10-CM | POA: Diagnosis not present

## 2014-02-02 DIAGNOSIS — F0390 Unspecified dementia without behavioral disturbance: Secondary | ICD-10-CM | POA: Diagnosis not present

## 2014-02-02 DIAGNOSIS — E039 Hypothyroidism, unspecified: Secondary | ICD-10-CM | POA: Diagnosis not present

## 2014-02-02 DIAGNOSIS — I872 Venous insufficiency (chronic) (peripheral): Secondary | ICD-10-CM | POA: Diagnosis not present

## 2014-02-02 DIAGNOSIS — I10 Essential (primary) hypertension: Secondary | ICD-10-CM | POA: Diagnosis not present

## 2014-02-02 DIAGNOSIS — F419 Anxiety disorder, unspecified: Secondary | ICD-10-CM | POA: Diagnosis not present

## 2014-02-02 DIAGNOSIS — M810 Age-related osteoporosis without current pathological fracture: Secondary | ICD-10-CM | POA: Diagnosis not present

## 2014-02-02 DIAGNOSIS — I429 Cardiomyopathy, unspecified: Secondary | ICD-10-CM | POA: Diagnosis not present

## 2014-02-02 DIAGNOSIS — K219 Gastro-esophageal reflux disease without esophagitis: Secondary | ICD-10-CM | POA: Diagnosis not present

## 2014-02-02 DIAGNOSIS — Z87891 Personal history of nicotine dependence: Secondary | ICD-10-CM | POA: Diagnosis not present

## 2014-02-02 DIAGNOSIS — E785 Hyperlipidemia, unspecified: Secondary | ICD-10-CM | POA: Diagnosis not present

## 2014-02-02 DIAGNOSIS — K589 Irritable bowel syndrome without diarrhea: Secondary | ICD-10-CM | POA: Diagnosis not present

## 2014-02-02 DIAGNOSIS — Z6821 Body mass index (BMI) 21.0-21.9, adult: Secondary | ICD-10-CM | POA: Diagnosis not present

## 2014-02-05 DIAGNOSIS — I10 Essential (primary) hypertension: Secondary | ICD-10-CM | POA: Diagnosis not present

## 2014-02-05 DIAGNOSIS — E039 Hypothyroidism, unspecified: Secondary | ICD-10-CM | POA: Diagnosis not present

## 2014-02-05 DIAGNOSIS — I429 Cardiomyopathy, unspecified: Secondary | ICD-10-CM | POA: Diagnosis not present

## 2014-02-05 DIAGNOSIS — M1990 Unspecified osteoarthritis, unspecified site: Secondary | ICD-10-CM | POA: Diagnosis not present

## 2014-02-05 DIAGNOSIS — K219 Gastro-esophageal reflux disease without esophagitis: Secondary | ICD-10-CM | POA: Diagnosis not present

## 2014-02-05 DIAGNOSIS — F419 Anxiety disorder, unspecified: Secondary | ICD-10-CM | POA: Diagnosis not present

## 2014-02-05 DIAGNOSIS — F0390 Unspecified dementia without behavioral disturbance: Secondary | ICD-10-CM | POA: Diagnosis not present

## 2014-02-05 DIAGNOSIS — K589 Irritable bowel syndrome without diarrhea: Secondary | ICD-10-CM | POA: Diagnosis not present

## 2014-02-05 DIAGNOSIS — Z6821 Body mass index (BMI) 21.0-21.9, adult: Secondary | ICD-10-CM | POA: Diagnosis not present

## 2014-02-05 DIAGNOSIS — E785 Hyperlipidemia, unspecified: Secondary | ICD-10-CM | POA: Diagnosis not present

## 2014-02-05 DIAGNOSIS — I872 Venous insufficiency (chronic) (peripheral): Secondary | ICD-10-CM | POA: Diagnosis not present

## 2014-02-05 DIAGNOSIS — M810 Age-related osteoporosis without current pathological fracture: Secondary | ICD-10-CM | POA: Diagnosis not present

## 2014-02-05 DIAGNOSIS — Z87891 Personal history of nicotine dependence: Secondary | ICD-10-CM | POA: Diagnosis not present

## 2014-02-13 DIAGNOSIS — C44612 Basal cell carcinoma of skin of right upper limb, including shoulder: Secondary | ICD-10-CM | POA: Diagnosis not present

## 2014-02-13 DIAGNOSIS — Z85828 Personal history of other malignant neoplasm of skin: Secondary | ICD-10-CM | POA: Diagnosis not present

## 2014-02-13 DIAGNOSIS — D0439 Carcinoma in situ of skin of other parts of face: Secondary | ICD-10-CM | POA: Diagnosis not present

## 2014-02-13 DIAGNOSIS — D485 Neoplasm of uncertain behavior of skin: Secondary | ICD-10-CM | POA: Diagnosis not present

## 2014-02-14 DIAGNOSIS — F0390 Unspecified dementia without behavioral disturbance: Secondary | ICD-10-CM

## 2014-02-14 DIAGNOSIS — I429 Cardiomyopathy, unspecified: Secondary | ICD-10-CM

## 2014-02-14 DIAGNOSIS — F419 Anxiety disorder, unspecified: Secondary | ICD-10-CM

## 2014-02-14 DIAGNOSIS — Z6821 Body mass index (BMI) 21.0-21.9, adult: Secondary | ICD-10-CM | POA: Diagnosis not present

## 2014-02-19 ENCOUNTER — Telehealth: Payer: Self-pay | Admitting: Family Medicine

## 2014-02-19 ENCOUNTER — Emergency Department: Payer: Self-pay | Admitting: Emergency Medicine

## 2014-02-19 DIAGNOSIS — R531 Weakness: Secondary | ICD-10-CM | POA: Diagnosis not present

## 2014-02-19 DIAGNOSIS — S8012XA Contusion of left lower leg, initial encounter: Secondary | ICD-10-CM | POA: Diagnosis not present

## 2014-02-19 NOTE — Telephone Encounter (Signed)
noted 

## 2014-02-19 NOTE — Telephone Encounter (Signed)
Pt fell 02/19/14 around 2 pm and has a skin tear on L lower on extremity and [facility] is treating wound per protocol per Orson Slick at [facility].

## 2014-02-20 ENCOUNTER — Telehealth: Payer: Self-pay | Admitting: Family Medicine

## 2014-02-20 NOTE — Telephone Encounter (Signed)
Lamona Curl called from [facility].  She stated that pt was sent to hospital and a dressing was put on.  Ms Ardyth Gal wants a signed order faxed back to [place] to keep the dressing on until February 17,2016 since pt is on asprin.  She faxed the order to [place] on Feb 20, 2014. Thanks

## 2014-02-20 NOTE — Telephone Encounter (Signed)
Fax received, and I will place in your inbox.

## 2014-02-20 NOTE — Telephone Encounter (Signed)
done

## 2014-02-21 NOTE — Telephone Encounter (Signed)
Form faxed back to requested party; sent for scanning.

## 2014-02-23 DIAGNOSIS — H3532 Exudative age-related macular degeneration: Secondary | ICD-10-CM | POA: Diagnosis not present

## 2014-02-26 ENCOUNTER — Ambulatory Visit: Payer: Medicare Other | Admitting: Podiatry

## 2014-02-26 ENCOUNTER — Ambulatory Visit: Payer: Medicare Other

## 2014-02-28 ENCOUNTER — Non-Acute Institutional Stay: Payer: Medicare Other | Admitting: Internal Medicine

## 2014-02-28 ENCOUNTER — Encounter: Payer: Self-pay | Admitting: Internal Medicine

## 2014-02-28 VITALS — BP 110/60 | HR 70 | Temp 97.5°F | Resp 18 | Wt 119.0 lb

## 2014-02-28 DIAGNOSIS — S81802D Unspecified open wound, left lower leg, subsequent encounter: Secondary | ICD-10-CM

## 2014-02-28 NOTE — Progress Notes (Signed)
Subjective:    Patient ID: Stacey Blankenship, female    DOB: 27-Aug-1915, 81 y.o.   MRN: [id number]  HPI  Asked to evaluate resident in ALF room 201 Sustained hematoma to LLE Went to [facility] ER, wound was debrided extensively She was placed on Doxycycline BID x 10 days for prophylaxis Wound has been draining serosanguinous fluid, but no warmth noted She has not had fevers Being covered with xeroform, gauze and compression wrap, changed daily History of CVI  Review of Systems      Past Medical History  Diagnosis Date  . Allergy   . GERD (gastroesophageal reflux disease)   . Hyperlipidemia   . Hypertension   . Hyperthyroidism   . Osteoporosis   . IBS (irritable bowel syndrome)   . Bundle branch block   . Chronic venous insufficiency   . Systolic CHF, chronic     severe MR  . Pulmonary hypertension     Current Outpatient Prescriptions  Medication Sig Dispense Refill  . ALPRAZolam (XANAX) 0.25 MG tablet 1/2 tab by mouth every 4 hours as needed for anxiety 15 tablet 5  . aspirin EC 81 MG tablet TAKE ONE TABLET EVERY DAY AFTER BREAFKAST ON SUNDAY, MON., WED. AND FRIDAY ONLY (YELLOW #121) PLACE IN 9AM SLOT 22 tablet 11  . carvedilol (COREG) 3.125 MG tablet Take 3.125 mg by mouth 2 (two) times daily with a meal.    . Cholecalciferol (VITAMIN D) 1000 UNITS capsule TAKE ONE GEL CAPSULE BY MOUTH EVERY DAY AFTER BREAKST (LIGHT YELLOW GEL CAP) PLACE IN 9AM SLOT 30 capsule 11  . furosemide (LASIX) 20 MG tablet Take 20 mg by mouth daily as needed.    Marland Kitchen losartan-hydrochlorothiazide (HYZAAR) 100-25 MG per tablet TAKE ONE TABLET BY MOUTH EVERY DAY AFTER BREAKFAST (YELLOW #7368) 30 tablet 11  . Multiple Vitamins-Minerals (PRESERVISION AREDS 2) CAPS TAKE ONE GEL CAPSULE BY MOUTH EVERY DAY AFTER BREAKFAST (BROWN GEL CAP) 30 capsule 11  . naproxen sodium (ANAPROX) 220 MG tablet Take 220 mg by mouth every morning. And 1 in evening prn    . omeprazole (PRILOSEC) 20 MG capsule TAKE ONE CAPSULE BY  MOUTH EVERY DAY BEFORE BREAKFAST 30 capsule 11  . polyethylene glycol powder (MIRALAX) powder Take 17 g by mouth daily. 527 g 1  . timolol (BETIMOL) 0.5 % ophthalmic solution Place 1 drop into both eyes daily.    . Multiple Vitamin (THEREMS) TABS TAKE ONE TABLET BY MOUTH EACH DAY. 30 tablet 11  . travoprost, benzalkonium, (TRAVATAN) 0.004 % ophthalmic solution Place 1 drop into both eyes at bedtime.     No current facility-administered medications for this visit.    No Known Allergies  History reviewed. No pertinent family history.  History   Social History  . Marital Status: Widowed    Spouse Name: N/A  . Number of Children: 2  . Years of Education: N/A   Occupational History  . Retired Education officer, museum    Social History Main Topics  . Smoking status: Former Smoker    Quit date: 02/18/2005  . Smokeless tobacco: Never Used  . Alcohol Use: Yes  . Drug Use: Not on file  . Sexual Activity: Not on file   Other Topics Concern  . Not on file   Social History Narrative   Has living will   Daughter Stanton Kidney is her health care POA   Has DNR and reviewed this order   Would not want tube feeds if cognitively unaware  Constitutional: Denies fever, malaise, fatigue, headache or abrupt weight changes.  Respiratory: Denies difficulty breathing, shortness of breath, cough or sputum production.   Cardiovascular: Denies chest pain, chest tightness, palpitations or swelling in the hands or feet.  Skin: Wound to LLE. Denies rashes, lesions.   No other specific complaints in a complete review of systems (except as listed in HPI above).  Objective:   Physical Exam  BP 110/60 mmHg  Pulse 70  Temp(Src) 97.5 F (36.4 C)  Resp 18  Wt 119 lb (53.978 kg)  SpO2 96% Wt Readings from Last 3 Encounters:  02/28/14 119 lb (53.978 kg)  01/23/14 119 lb 12 oz (54.318 kg)  11/09/13 122 lb (55.339 kg)    General: Appears her stated age, chronically ill appearing in NAD. Skin: Open lesion to  left anterior lower leg, granulation tissue noted. No drainage expressed although serosanguinous drainage noted on bandage. No pain with palpation. No warmth or odor. Mild redness noted around would. Cardiovascular: Normal rate and rhythm. Murmur noted. Pulmonary/Chest: Normal effort and positive vesicular breath sounds. No respiratory distress. No wheezes, rales or ronchi noted.   BMET    Component Value Date/Time   NA 136 05/02/2012 1503   K 3.3* 05/02/2012 1503   CL 98 05/02/2012 1503   CO2 29 05/02/2012 1503   GLUCOSE 97 05/02/2012 1503   BUN 27* 05/02/2012 1503   CREATININE 0.8 05/02/2012 1503   CALCIUM 9.0 05/02/2012 1503   GFRNONAA 74.42 12/09/2009 1107    Lipid Panel  No results found for: CHOL, TRIG, HDL, CHOLHDL, VLDL, LDLCALC  CBC    Component Value Date/Time   WBC 6.1 05/02/2012 1503   RBC 4.73 05/02/2012 1503   HGB 14.6 05/02/2012 1503   HCT 42.7 05/02/2012 1503   PLT 136.0* 05/02/2012 1503   MCV 90.3 05/02/2012 1503   MCHC 34.2 05/02/2012 1503   RDW 13.0 05/02/2012 1503   LYMPHSABS 1.0 05/02/2012 1503   MONOABS 0.3 05/02/2012 1503   EOSABS 0.0 05/02/2012 1503   BASOSABS 0.0 05/02/2012 1503    Hgb A1C No results found for: HGBA1C       Assessment & Plan:   Wound to LLE:  No s/s of infection Continue prophylactic Doxycycline Continue current treatment orders Watch for increased redness, pain, drainage or fever  Will reassess prn

## 2014-03-01 ENCOUNTER — Telehealth: Payer: Self-pay

## 2014-03-01 ENCOUNTER — Ambulatory Visit (INDEPENDENT_AMBULATORY_CARE_PROVIDER_SITE_OTHER): Payer: Medicare Other | Admitting: Podiatry

## 2014-03-01 DIAGNOSIS — M79676 Pain in unspecified toe(s): Secondary | ICD-10-CM

## 2014-03-01 DIAGNOSIS — B351 Tinea unguium: Secondary | ICD-10-CM | POA: Diagnosis not present

## 2014-03-01 NOTE — Telephone Encounter (Signed)
I am not in office until Monday.  Rollene Fare, can you please see if this is on my desk and address it for me?  thanks

## 2014-03-01 NOTE — Telephone Encounter (Signed)
Erline Levine nurse with [facility] left v/m requesting cb for confirmation that Dr Deborra Medina received fax sent on 02/28/14 regarding possible med changes. Stacey request cb.

## 2014-03-01 NOTE — Progress Notes (Signed)
Patient ID: Stacey Blankenship, female   DOB: 1915/07/18, 81 y.o.   MRN: [id number]  Subjective: 81 y.o.-year-old female returns the office today for painful, elongated, thickened toenails. She states she is unable to trim them herself. Denies any redness or drainage around the nails. States that approximatly 3 weeks ago she had an injury to her left leg, resulting in a wound which she states is under the care of another physician at this time. Her caregiver who is with her today, states hospice changes the dressings daily as well. Denies any acute changes since last appointment and no new complaints today. Denies any systemic complaints such as fevers, chills, nausea, vomiting.   Objective: AAO 3, NAD DP/PT pulses palpable, CRT less than 3 seconds Protective sensation intact with Simms Weinstein monofilament, Achilles tendon reflex intact.  Nails hypertrophic, dystrophic, elongated, brittle, discolored 10. There is tenderness overlying these nails 1-5 bilaterally. There is no surrounding erythema or drainage along the nail sites. There is a bandage over the left leg. There is apparently a wound underneath the dressing. She states as she is already under the care of another physician she did not want it changed.  No other areas of tenderness bilateral lower extremities. No overlying edema, erythema, increased warmth. No pain with calf compression, swelling, warmth, erythema.  Assessment: Patient presents with symptomatic onychomycosis  Plan: -Treatment options including alternatives, risks, complications were discussed -Nails sharply debrided 10 without complication/bleeding. -Follow-up with other physician for left leg wound.  -Discussed daily foot inspection. If there are any changes, to call the office immediately.  -Follow-up in 3 months or sooner if any problems are to arise. In the meantime, encouraged to call the office with any questions, concerns, changes symptoms.

## 2014-03-02 NOTE — Telephone Encounter (Signed)
Found form in Dr. Hulen Shouts box, reviewed, signed and given to MYD to fax to [facility] at [facility]

## 2014-03-02 NOTE — Telephone Encounter (Signed)
Form faxed as instructed. 

## 2014-03-06 DIAGNOSIS — Z6821 Body mass index (BMI) 21.0-21.9, adult: Secondary | ICD-10-CM | POA: Diagnosis not present

## 2014-03-06 DIAGNOSIS — Z87891 Personal history of nicotine dependence: Secondary | ICD-10-CM | POA: Diagnosis not present

## 2014-03-06 DIAGNOSIS — E785 Hyperlipidemia, unspecified: Secondary | ICD-10-CM | POA: Diagnosis not present

## 2014-03-06 DIAGNOSIS — F419 Anxiety disorder, unspecified: Secondary | ICD-10-CM | POA: Diagnosis not present

## 2014-03-06 DIAGNOSIS — K589 Irritable bowel syndrome without diarrhea: Secondary | ICD-10-CM | POA: Diagnosis not present

## 2014-03-06 DIAGNOSIS — I429 Cardiomyopathy, unspecified: Secondary | ICD-10-CM | POA: Diagnosis not present

## 2014-03-06 DIAGNOSIS — I10 Essential (primary) hypertension: Secondary | ICD-10-CM | POA: Diagnosis not present

## 2014-03-06 DIAGNOSIS — M1990 Unspecified osteoarthritis, unspecified site: Secondary | ICD-10-CM | POA: Diagnosis not present

## 2014-03-06 DIAGNOSIS — E039 Hypothyroidism, unspecified: Secondary | ICD-10-CM | POA: Diagnosis not present

## 2014-03-06 DIAGNOSIS — M810 Age-related osteoporosis without current pathological fracture: Secondary | ICD-10-CM | POA: Diagnosis not present

## 2014-03-06 DIAGNOSIS — K219 Gastro-esophageal reflux disease without esophagitis: Secondary | ICD-10-CM | POA: Diagnosis not present

## 2014-03-06 DIAGNOSIS — I872 Venous insufficiency (chronic) (peripheral): Secondary | ICD-10-CM | POA: Diagnosis not present

## 2014-03-06 DIAGNOSIS — F0390 Unspecified dementia without behavioral disturbance: Secondary | ICD-10-CM | POA: Diagnosis not present

## 2014-03-07 ENCOUNTER — Telehealth: Payer: Self-pay | Admitting: Family Medicine

## 2014-03-07 MED ORDER — ALPRAZOLAM 0.5 MG PO TABS: ORAL_TABLET | ORAL | Status: DC

## 2014-03-07 MED ORDER — TRAMADOL HCL 50 MG PO TABS: ORAL_TABLET | ORAL | Status: AC

## 2014-03-07 MED ORDER — ALPRAZOLAM 0.25 MG PO TABS: ORAL_TABLET | ORAL | Status: DC

## 2014-03-07 NOTE — Telephone Encounter (Signed)
Rx faxed to requested pharmacy 

## 2014-03-07 NOTE — Telephone Encounter (Signed)
Dr. Deborra Medina,  1. Can I get you to send a hard script in to [facility] for new orders for Xanax: 0.5mg   tablet twice daily scheduled that we started Friday? We are monitoring things here and we may need to look at an afternoon dose or at least keeping the scheduled and just adding a prn dose for afternoon. For now though, we will work with the scheduled dose and give it some more time. Ms. Stacey Blankenship is really up and down right now. She had a very difficult weekend and this leg wound definitely complicates things. We are having a care plan meeting this week with hospice and family to discuss care.  2. What can you recommend for increased leg pain related to injury? She has been on scheduled Aleve 220 (2) pills every am for some time for back pain, but now with addition of leg injury, she is reporting more pain throughout the day. She refuses to take the Tylenol as she says that does nothing for her. I do not have anything else standing that I can give her. What do you recommend for better pain control? Her eye doctor (Dr. Oval Linsey) was concerned with her taking increased doses of aleve or ibuprofen r/t increased bleeding risk with her eye condition. (*resident has severe mac. Degeneration and goes for injections every month, she has had complications with bleeding in past post injection). Ms. Charrier also takes a low dose ASA every Sun, Mon, Wed, and Fri only.  Thank you for your help with this. [name] issues and needs can be complex and difficult to manage.Chana Bode, [facility]  [address], [place] [address]  Phone: [phone]  Fax: [phone] Attn: Leafy Ro  [email or web address]  Yeah, I think the Tramadol is worth a try .because we are somewhat limited with her restrictions. Who knows, this may also give her better coverage on her back pain as well.  Rx for xanax written as requested above.  Also written rx for Tramadol 50  mg - 1/2- 1 tab po twice daily prn pain. Please fax to [facility].  Rxs added to med list.

## 2014-03-07 NOTE — Telephone Encounter (Signed)
Rx faxed to [facility] as requested

## 2014-03-07 NOTE — Telephone Encounter (Signed)
Dr. Deborra Medina, I am so sorry for the complexity and back and forth on this but we have had a further development with Ms. Stacey Blankenship. We increased the am dose of her Xanax this am to the .25mg  and have kept her on the .25mg  at bedtime. She DID NOT tolerate that dose well this morning. She was very loopy, drugged-like, and sedated. We had to keep her in and bring meals up as we were afraid of her falling. So, while anxiety continues to be ramped up in early am's and afternoons, the increased dosing to .25mg  from  of .25 was just too much for her. I am sorry to ask this but I am going to have to ask you to d/c the .25mg  bid dosing and change to  of .25 qam and (1) whole .25mg  every pm. She tolerated that much better. The whole .25mg  at bedtime has been working very well, it's just the daytime we need better coverage. I don't know if we would try adding a prn dose on top of this or not. Hospice nurse wanted to ask you about the use of low dose Seroquel or Zyprexa. I am not sure how I feel about either one of those, but Stacey Blankenship definitely struggles with A LOT of fear, anxiety and has regular panic attacks. Yesterday and today have been better days, but she definitely cycles, this past weekend was terrible! Suggestions? Leticia Penna, [facility] printed for xanax and replied to [place] about her question about antipsychotics.  Awaiting answer.

## 2014-03-13 ENCOUNTER — Telehealth: Payer: Self-pay | Admitting: Family Medicine

## 2014-03-13 ENCOUNTER — Other Ambulatory Visit: Payer: Self-pay | Admitting: Family Medicine

## 2014-03-13 MED ORDER — RISPERIDONE 0.25 MG PO TABS: 0.2500 mg | ORAL_TABLET | Freq: Every day | ORAL | Status: AC

## 2014-03-13 NOTE — Telephone Encounter (Signed)
Rx faxed as requested.

## 2014-03-13 NOTE — Telephone Encounter (Signed)
Just received email from Leticia Penna, RN at [facility] asking for new rx as entered below to be sent to [facility].  Please fax as soon as you can. Thanks!

## 2014-03-13 NOTE — Telephone Encounter (Signed)
Received the following email from Leticia Penna, RN:  Dr. Deborra Medina,  Update on Stacey Blankenship. Her situation remains complex and we continue to struggle to manage her symptoms here in Assisted Living. She really needs to move to Healthcare; however, when meeting on Friday with family..until [facility] says she has to move, family is not ready to agree to it. In our ongoing quest to help her with panic, anxiety episodes and SOB, Hospice, family and I have discussed starting the Risperdal. They are in agreement with a trial of that this week.  1. Please give orders for Risperdal  2. When we changed Xanax orders back to  .25mg  q am and 1 .25mg  qpm..I d/c'ed the PRN Xanax order.Marland KitchenMarland KitchenI probably shouldn't have done that. We had a severe anxiety/SOB episode with her last night and it was to the point where she couldn't breathe and was heaving, felt as though she was going to throw up. We had no prn orders to assist her with. Would it be appropriate to re-instate PRN ORDER in addition to keeping the scheduled dosing?  3. Leg Wound - we started Aquacel last week. Leg edema has improved but has developed some redness and warmth at wound site. She continues to complain of intermittent pains to that leg and has moderate amount of serosanginous drainage. There is no odor or purulent drainage. She did finish antibiotic course (10days of Doxycycline on 2/26). Any other wound care recommendations at this point?  Rx for Risperdal printed out.  Please fax to [facility].

## 2014-03-13 NOTE — Telephone Encounter (Signed)
I did not see this before I left. Will you be at the office in the morning to take care of this? If not, I can give a handwritten script to [facility] while I am at the office tomorrow

## 2014-03-14 MED ORDER — ALPRAZOLAM 0.25 MG PO TABS: ORAL_TABLET | ORAL | Status: DC

## 2014-03-14 NOTE — Telephone Encounter (Signed)
Faxed to pharmacy as requested

## 2014-03-16 ENCOUNTER — Non-Acute Institutional Stay: Payer: Medicare Other | Admitting: Internal Medicine

## 2014-03-16 ENCOUNTER — Encounter: Payer: Self-pay | Admitting: Internal Medicine

## 2014-03-16 VITALS — BP 122/80 | HR 68 | Temp 97.1°F | Resp 18 | Wt 118.0 lb

## 2014-03-16 DIAGNOSIS — S81802D Unspecified open wound, left lower leg, subsequent encounter: Secondary | ICD-10-CM

## 2014-03-16 NOTE — Progress Notes (Signed)
Subjective:    Patient ID: Stacey Blankenship, female    DOB: 08-Nov-1915, 81 y.o.   MRN: [id number]  HPI  Asked to recheck resident in [address] Left lower leg wound, continues to have mild redness and drainage Finished 10 day course of Doxy No fever, no pain in area RN has been dressing with Aquacel and Xeroform  Review of Systems      Past Medical History  Diagnosis Date  . Allergy   . GERD (gastroesophageal reflux disease)   . Hyperlipidemia   . Hypertension   . Hyperthyroidism   . Osteoporosis   . IBS (irritable bowel syndrome)   . Bundle branch block   . Chronic venous insufficiency   . Systolic CHF, chronic     severe MR  . Pulmonary hypertension     Current Outpatient Prescriptions  Medication Sig Dispense Refill  . ALPRAZolam (XANAX) 0.25 MG tablet 1/2 tab po qam and 1 tab by mouth qhs and may take up to 2 additional times daily for prn SOB/anxiety 75 tablet 4  . aspirin EC 81 MG tablet TAKE ONE TABLET EVERY DAY AFTER BREAFKAST ON SUNDAY, MON., WED. AND FRIDAY ONLY (YELLOW #121) PLACE IN 9AM SLOT 22 tablet 11  . carvedilol (COREG) 3.125 MG tablet Take 3.125 mg by mouth 2 (two) times daily with a meal.    . Cholecalciferol (VITAMIN D) 1000 UNITS capsule TAKE ONE GEL CAPSULE BY MOUTH EVERY DAY AFTER BREAKST (LIGHT YELLOW GEL CAP) PLACE IN 9AM SLOT 30 capsule 11  . furosemide (LASIX) 20 MG tablet Take 20 mg by mouth daily as needed.    Marland Kitchen losartan-hydrochlorothiazide (HYZAAR) 100-25 MG per tablet TAKE ONE TABLET BY MOUTH EVERY DAY AFTER BREAKFAST (YELLOW #7368) 30 tablet 11  . Multiple Vitamin (THEREMS) TABS TAKE ONE TABLET BY MOUTH EACH DAY. 30 tablet 11  . Multiple Vitamins-Minerals (PRESERVISION AREDS 2) CAPS TAKE ONE GEL CAPSULE BY MOUTH EVERY DAY AFTER BREAKFAST (BROWN GEL CAP) 30 capsule 11  . naproxen sodium (ANAPROX) 220 MG tablet Take 220 mg by mouth every morning. And 1 in evening prn    . omeprazole (PRILOSEC) 20 MG capsule TAKE ONE CAPSULE BY MOUTH EVERY DAY  BEFORE BREAKFAST 30 capsule 11  . polyethylene glycol powder (MIRALAX) powder Take 17 g by mouth daily. 527 g 1  . risperiDONE (RISPERDAL) 0.25 MG tablet Take 1 tablet (0.25 mg total) by mouth at bedtime. 30 tablet 3  . timolol (BETIMOL) 0.5 % ophthalmic solution Place 1 drop into both eyes daily.    . traMADol (ULTRAM) 50 MG tablet 1/2- 1 tab po twice daily prn pain 30 tablet 0  . travoprost, benzalkonium, (TRAVATAN) 0.004 % ophthalmic solution Place 1 drop into both eyes at bedtime.     No current facility-administered medications for this visit.    No Known Allergies  History reviewed. No pertinent family history.  History   Social History  . Marital Status: Widowed    Spouse Name: N/A  . Number of Children: 2  . Years of Education: N/A   Occupational History  . Retired Education officer, [employer]    Social History Main Topics  . Smoking status: Former Smoker    Quit date: 02/18/2005  . Smokeless tobacco: Never Used  . Alcohol Use: Yes  . Drug Use: Not on file  . Sexual Activity: Not on file   Other Topics Concern  . Not on file   Social History Narrative   Has living will  Daughter Stanton Kidney is her health care POA   Has DNR and reviewed this order   Would not want tube feeds if cognitively unaware     Constitutional: Denies fever, malaise, fatigue, headache or abrupt weight changes.  Respiratory: Denies difficulty breathing, shortness of breath, cough or sputum production.   Cardiovascular: Denies chest pain, chest tightness, palpitations or swelling in the hands or feet.  Skin: Pt reports wound to LLE. Denies rashes, lesions or ulcercations.    No other specific complaints in a complete review of systems (except as listed in HPI above).  Objective:   Physical Exam   BP 122/80 mmHg  Pulse 68  Temp(Src) 97.1 F (36.2 C)  Resp 18  Wt 118 lb (53.524 kg) Wt Readings from Last 3 Encounters:  03/16/14 118 lb (53.524 kg)  02/28/14 119 lb (53.978 kg)  01/23/14 119 lb 12 oz  (54.318 kg)    General: Appears her stated age, chronically ill appearing in NAD. Skin: Warm, dry and intact. 1 cm round wound to left anterior shin. Open but not draining. No redness or warmth noted. 2 cm wound to medial left lower leg. Draining serous fluid, mild redness but no warmth or pain. Cardiovascular: Normal rate and rhythm. S1,S2 noted.  No murmur, rubs or gallops noted.  Pulmonary/Chest: Normal effort and positive vesicular breath sounds. No respiratory distress. No wheezes, rales or ronchi noted.    BMET    Component Value Date/Time   NA 136 05/02/2012 1503   K 3.3* 05/02/2012 1503   CL 98 05/02/2012 1503   CO2 29 05/02/2012 1503   GLUCOSE 97 05/02/2012 1503   BUN 27* 05/02/2012 1503   CREATININE 0.8 05/02/2012 1503   CALCIUM 9.0 05/02/2012 1503   GFRNONAA 74.42 12/09/2009 1107    Lipid Panel  No results found for: CHOL, TRIG, HDL, CHOLHDL, VLDL, LDLCALC  CBC    Component Value Date/Time   WBC 6.1 05/02/2012 1503   RBC 4.73 05/02/2012 1503   HGB 14.6 05/02/2012 1503   HCT 42.7 05/02/2012 1503   PLT 136.0* 05/02/2012 1503   MCV 90.3 05/02/2012 1503   MCHC 34.2 05/02/2012 1503   RDW 13.0 05/02/2012 1503   LYMPHSABS 1.0 05/02/2012 1503   MONOABS 0.3 05/02/2012 1503   EOSABS 0.0 05/02/2012 1503   BASOSABS 0.0 05/02/2012 1503    Hgb A1C No results found for: HGBA1C      Assessment & Plan:  Wound to LLE:  Healing nicely Continue current treatment plan Watch for fevers, increased redness, pain or purulent draiange  Will follow up as needed

## 2014-03-23 ENCOUNTER — Telehealth: Payer: Self-pay | Admitting: Family Medicine

## 2014-03-23 NOTE — Telephone Encounter (Signed)
Seth Bake [facility] @ [address] dropped off ppw for Dr. Deborra Medina to complete.  Best number to call Seth Bake when ppw completed is [phone]/ lt

## 2014-03-23 NOTE — Telephone Encounter (Signed)
Form placed in dr Hulen Shouts inbox for review and completion

## 2014-03-26 DIAGNOSIS — Z7689 Persons encountering health services in other specified circumstances: Secondary | ICD-10-CM

## 2014-03-26 NOTE — Telephone Encounter (Signed)
Seth Bake notified form ready for pick up

## 2014-03-26 NOTE — Telephone Encounter (Signed)
Form signed and in my box. 

## 2014-04-06 DIAGNOSIS — Z6821 Body mass index (BMI) 21.0-21.9, adult: Secondary | ICD-10-CM | POA: Diagnosis not present

## 2014-04-06 DIAGNOSIS — M810 Age-related osteoporosis without current pathological fracture: Secondary | ICD-10-CM | POA: Diagnosis not present

## 2014-04-06 DIAGNOSIS — H3532 Exudative age-related macular degeneration: Secondary | ICD-10-CM | POA: Diagnosis not present

## 2014-04-06 DIAGNOSIS — Z87891 Personal history of nicotine dependence: Secondary | ICD-10-CM | POA: Diagnosis not present

## 2014-04-06 DIAGNOSIS — K219 Gastro-esophageal reflux disease without esophagitis: Secondary | ICD-10-CM | POA: Diagnosis not present

## 2014-04-06 DIAGNOSIS — E785 Hyperlipidemia, unspecified: Secondary | ICD-10-CM | POA: Diagnosis not present

## 2014-04-06 DIAGNOSIS — I429 Cardiomyopathy, unspecified: Secondary | ICD-10-CM | POA: Diagnosis not present

## 2014-04-06 DIAGNOSIS — F0390 Unspecified dementia without behavioral disturbance: Secondary | ICD-10-CM | POA: Diagnosis not present

## 2014-04-06 DIAGNOSIS — I10 Essential (primary) hypertension: Secondary | ICD-10-CM | POA: Diagnosis not present

## 2014-04-06 DIAGNOSIS — I872 Venous insufficiency (chronic) (peripheral): Secondary | ICD-10-CM | POA: Diagnosis not present

## 2014-04-06 DIAGNOSIS — M1990 Unspecified osteoarthritis, unspecified site: Secondary | ICD-10-CM | POA: Diagnosis not present

## 2014-04-06 DIAGNOSIS — K589 Irritable bowel syndrome without diarrhea: Secondary | ICD-10-CM | POA: Diagnosis not present

## 2014-04-06 DIAGNOSIS — F419 Anxiety disorder, unspecified: Secondary | ICD-10-CM | POA: Diagnosis not present

## 2014-04-06 DIAGNOSIS — E039 Hypothyroidism, unspecified: Secondary | ICD-10-CM | POA: Diagnosis not present

## 2014-04-19 ENCOUNTER — Ambulatory Visit (INDEPENDENT_AMBULATORY_CARE_PROVIDER_SITE_OTHER): Payer: Medicare Other | Admitting: Family Medicine

## 2014-04-19 ENCOUNTER — Encounter: Payer: Self-pay | Admitting: Family Medicine

## 2014-04-19 VITALS — BP 136/78 | HR 51 | Temp 97.8°F | Wt 117.0 lb

## 2014-04-19 DIAGNOSIS — I270 Primary pulmonary hypertension: Secondary | ICD-10-CM | POA: Diagnosis not present

## 2014-04-19 DIAGNOSIS — K5900 Constipation, unspecified: Secondary | ICD-10-CM | POA: Diagnosis not present

## 2014-04-19 DIAGNOSIS — I272 Pulmonary hypertension, unspecified: Secondary | ICD-10-CM

## 2014-04-19 DIAGNOSIS — I5022 Chronic systolic (congestive) heart failure: Secondary | ICD-10-CM

## 2014-04-19 DIAGNOSIS — F411 Generalized anxiety disorder: Secondary | ICD-10-CM | POA: Diagnosis not present

## 2014-04-19 NOTE — Assessment & Plan Note (Signed)
Now with diarrhea. D/c scheduled miralax.  Start miralax prn constipation.

## 2014-04-19 NOTE — Progress Notes (Signed)
Subjective:   Patient ID: Stacey Blankenship, female    DOB: March 22, 1915, 81 y.o.   MRN: [id number]  Stacey Blankenship is a pleasant 81 y.o. year old female pt, new to me with complicated medical history, who presents to clinic today with her son Stacey Blankenship, to Follow-up  on 04/19/2014  HPI:  Her birthday is next week- she will be 61!   Stacey Blankenship has moved to health care facility due to increased care needs, also now on hospice. She has CHF with recent progression of symptoms (followed by Dr. Nehemiah Massed).   She has been having more SOB and anxiety related to her SOB- celexa 10 mg daily started last week. She is on lasix.  Also wears compression hose.  She does feel she is sleeping better.  A little less anxious.  Also has prn xanax.  Did have some bowel incontinence last week.  On miralax daily (not prn).  Current Outpatient Prescriptions on File Prior to Visit  Medication Sig Dispense Refill  . ALPRAZolam (XANAX) 0.25 MG tablet 1/2 tab po qam and 1 tab by mouth qhs and may take up to 2 additional times daily for prn SOB/anxiety 75 tablet 4  . aspirin EC 81 MG tablet TAKE ONE TABLET EVERY DAY AFTER BREAFKAST ON SUNDAY, MON., WED. AND FRIDAY ONLY (YELLOW #121) PLACE IN 9AM SLOT 22 tablet 11  . carvedilol (COREG) 3.125 MG tablet Take 3.125 mg by mouth 2 (two) times daily with a meal.    . Cholecalciferol (VITAMIN D) 1000 UNITS capsule TAKE ONE GEL CAPSULE BY MOUTH EVERY DAY AFTER BREAKST (LIGHT YELLOW GEL CAP) PLACE IN 9AM SLOT 30 capsule 11  . furosemide (LASIX) 20 MG tablet Take 20 mg by mouth daily as needed.    Marland Kitchen losartan-hydrochlorothiazide (HYZAAR) 100-25 MG per tablet TAKE ONE TABLET BY MOUTH EVERY DAY AFTER BREAKFAST (YELLOW #7368) 30 tablet 11  . Multiple Vitamin (THEREMS) TABS TAKE ONE TABLET BY MOUTH EACH DAY. 30 tablet 11  . Multiple Vitamins-Minerals (PRESERVISION AREDS 2) CAPS TAKE ONE GEL CAPSULE BY MOUTH EVERY DAY AFTER BREAKFAST (BROWN GEL CAP) 30 capsule 11  .  naproxen sodium (ANAPROX) 220 MG tablet Take 220 mg by mouth every morning. And 1 in evening prn    . omeprazole (PRILOSEC) 20 MG capsule TAKE ONE CAPSULE BY MOUTH EVERY DAY BEFORE BREAKFAST 30 capsule 11  . polyethylene glycol powder (MIRALAX) powder Take 17 g by mouth daily. 527 g 1  . risperiDONE (RISPERDAL) 0.25 MG tablet Take 1 tablet (0.25 mg total) by mouth at bedtime. 30 tablet 3  . timolol (BETIMOL) 0.5 % ophthalmic solution Place 1 drop into both eyes daily.    . traMADol (ULTRAM) 50 MG tablet 1/2- 1 tab po twice daily prn pain 30 tablet 0  . travoprost, benzalkonium, (TRAVATAN) 0.004 % ophthalmic solution Place 1 drop into both eyes at bedtime.     No current facility-administered medications on file prior to visit.    No Known Allergies  Past Medical History  Diagnosis Date  . Allergy   . GERD (gastroesophageal reflux disease)   . Hyperlipidemia   . Hypertension   . Hyperthyroidism   . Osteoporosis   . IBS (irritable bowel syndrome)   . Bundle branch block   . Chronic venous insufficiency   . Systolic CHF, chronic     severe MR  . Pulmonary hypertension     Past Surgical History  Procedure Laterality Date  . Tonsillectomy and adenoidectomy    .  Appendectomy  01/05/1918  . Pneumonia  01/05/1990  . Back surgery  01/05/2002  . Ablation saphenous vein w/ rfa  01/05/2006    No family history on file.  History   Social History  . Marital Status: Widowed    Spouse Name: N/A  . Number of Children: 2  . Years of Education: N/A   Occupational History  . Retired Education officer, [employer]    Social History Main Topics  . Smoking status: Former Smoker    Quit date: 02/18/2005  . Smokeless tobacco: Never Used  . Alcohol Use: Yes  . Drug Use: Not on file  . Sexual Activity: Not on file   Other Topics Concern  . Not on file   Social History Narrative   Has living will   Daughter Stanton Kidney is her health care POA   Has DNR and reviewed this order   Would not want tube  feeds if cognitively unaware   The PMH, PSH, Social History, Family History, Medications, and allergies have been reviewed in [facility], and have been updated if relevant.   Review of Systems  Constitutional: Positive for appetite change.  HENT: Negative.   Respiratory: Positive for shortness of breath.   Cardiovascular: Negative for chest pain.  Gastrointestinal: Positive for diarrhea. Negative for abdominal pain and abdominal distention.  Endocrine: Negative.   Genitourinary: Negative.   Musculoskeletal: Negative.   Skin: Negative.   Allergic/Immunologic: Negative.   Neurological: Negative.   Hematological: Negative.   Psychiatric/Behavioral: Negative for suicidal ideas, sleep disturbance and self-injury. The patient is nervous/anxious.   All other systems reviewed and are negative.      Objective:    BP 136/78 mmHg  Pulse 51  Temp(Src) 97.8 F (36.6 C) (Oral)  Wt 117 lb (53.071 kg)  SpO2 96% BP Readings from Last 3 Encounters:  04/19/14 136/78  03/16/14 122/80  02/28/14 110/60   Wt Readings from Last 3 Encounters:  04/19/14 117 lb (53.071 kg)  03/16/14 118 lb (53.524 kg)  02/28/14 119 lb (53.978 kg)     Physical Exam  Constitutional: She is oriented to person, place, and time. She appears well-developed and well-nourished. No distress.  In wheelchair Frail, pleasant  HENT:  Head: Normocephalic.  Eyes: Conjunctivae are normal.  Neck: Normal range of motion.  Cardiovascular:  Murmur heard. Pulmonary/Chest: Effort normal. No respiratory distress. She has no wheezes.  Abdominal: Soft.  Musculoskeletal:  Trace LE edema bilaterally  Neurological: She is alert and oriented to person, place, and time. No cranial nerve deficit.  Skin: Skin is warm and dry.  Psychiatric: She has a normal mood and affect. Her behavior is normal. Judgment and thought content normal.  Nursing note and vitals reviewed.         Assessment & Plan:   No diagnosis found. No Follow-up  on file.

## 2014-04-19 NOTE — Assessment & Plan Note (Signed)
Progressive.  On hospice.  See above.

## 2014-04-19 NOTE — Assessment & Plan Note (Signed)
Deteriorated expectedly with move to health care facility. Continue celexa with prn xanax. She seems to be coping a little better this week. >25 minutes spent in face to face time with patient, >50% spent in counselling or coordination of care

## 2014-04-19 NOTE — Assessment & Plan Note (Signed)
Progressive. On hospice without O2 at this point. More anxiety related to SOB.  This seems to be improving. Weight is relatively stable.  She feels her appetite is good.

## 2014-04-19 NOTE — Progress Notes (Signed)
Pre visit review using our clinic review tool, if applicable. No additional management support is needed unless otherwise documented below in the visit note. 

## 2014-04-28 NOTE — H&P (Signed)
PATIENT NAME:  Stacey Blankenship, Stacey Blankenship MR#:  [id number] DATE OF BIRTH:  04-17-1915  DATE OF ADMISSION:  11/26/2013  PRIMARY CARE PHYSICIAN: Venia Carbon, MD  REFERRING PHYSICIAN: ;Brunilda Payor A. Edd Fabian, MD  CHIEF COMPLAINT: Shortness of breath today.   HISTORY OF PRESENT ILLNESS: A 81 year old Caucasian female with a history of hypertension, CHF presented to the ED with the above chief complaint. The patient is alert, awake, oriented, in no acute distress. The patient said that she has had shortness of breath with productive cough for the past 1 day but she denies any fever or chills, no chest pain, palpitations, orthopnea or nocturnal dyspnea. The patient has leg edema on and off, on p.r.n. Lasix, but the patient said that she does not like Lasix due to frequent urination and dry eyes.   The patient's chest x-ray in the ED showed right-sided pneumonia.   The patient also has elevated BNP which is 6397. The patient was treated with Zithromax, Rocephin and Lasix in the ED.   PAST MEDICAL HISTORY: Hypertension, CHF, heart wall disease, dementia, macular degeneration, GERD.  SURGICAL HISTORY: Back surgery.   SOCIAL HISTORY: No smoking or drinking or illicit drugs. Living in assisted living.  FAMILY HISTORY: Father had a stroke.   REVIEW OF SYSTEMS: CONSTITUTIONAL: The patient denies any fever or chills. No headache. No dizziness, but has weakness.  EYES: No double vision or blurred vision.  EARS, NOSE AND THROAT: No postnasal drip, slurred speech or dysphagia.  CARDIOVASCULAR: No chest pain, palpitations, orthopnea or nocturnal dyspnea, but has leg edema.  PULMONARY: Positive for cough, sputum, shortness of breath. No hemoptysis. No wheezing.  GASTROINTESTINAL: No abdominal pain, nausea, vomiting, diarrhea. No melena or bloody stool.  GENITOURINARY: No dysuria, hematuria, but has incontinence.  SKIN: No rash or jaundice.  NEUROLOGIC: No syncope, loss of consciousness or seizure.  ENDOCRINE: No  polyuria or polydipsia, heat or cold tolerance.  HEMATOLOGY: No easy bruising or bleeding.   ALLERGIES: None.  HOME MEDICATIONS:  1.  PreserVision 1 capsule once a day.  2.  Losartan/hydrochlorothiazide 100/25 mg p.o. daily.  3.  Aspirin 81 mg p.o. on Sunday, Monday, Wednesday and Friday.  4.  Omeprazole 20 mg p.o. daily. 5.  Vitamin D 1000 international units 1 p.o. once a day.  6.  Multivitamin 1 tablet once a day. 7.  Aleve 220 mg 2 pills daily p.r.n.  8.  Artificial tears 1 drop 4 times daily for comfort.  9.  MiraLax powder half capful p.o. in juice or water q.a.m.  10.  Timolol 0.5% eyedrop 1 drop to both eyes in the a.m.  11.  Travatan Z 0.004% eyedrop 1 drop to both eyes at bedtime.  12.  Lasix 20 mg p.o. p.r.n. for swelling and edema.   PHYSICAL EXAMINATION:  VITAL SIGNS: Temperature 98.1, blood pressure 127/94, pulse 89, oxygen saturation 98% on oxygen.  GENERAL: The patient is alert, awake, oriented, in no acute distress.  HEENT: Pupils round, equal and reactive to light and accommodation. Moist oral mucosa. Clear oropharynx.  NECK: Supple. No JVD or carotid bruit. No lymphadenopathy. No thyromegaly.  CARDIOVASCULAR: S1 and S2, regular rate and rhythm. No murmurs or gallops.  PULMONARY: Bilateral air entry. No wheezing, but has some rales and crackles bibasilar.  Mild use of accessory muscles to breathe. ABDOMEN: Soft. No distention or tenderness. No organomegaly. Bowel sounds present.  EXTREMITIES: Bilateral lower extremity edema 1+ with some erythema on both lower extremities and near ankle. According to the  patient and the patient's son, the patient has chronic redness of bilateral lower extremities. No cyanosis. No calf tenderness.  SKIN: No rash or jaundice.  NEUROLOGY: A and O x 3. No focal deficits. Power 5/5. Sensation intact.   LABORATORY DATA: Chest x-ray shows new right upper lobe and lower lobe pneumonia, hyperinflated lungs. Urinalysis negative. Troponin 0.09.  INR 1.1. CBC in normal range except platelets of 134,000. Glucose 104, BUN 26, creatinine 0.8, potassium 3.4, sodium 139, bicarb 31, chloride 101. BNP 3697.   EKG showed sinus rhythm with PAC at 90 BPM with a right bundle branch block, left ventricular hypertrophy, left anterior fascicular block.   IMPRESSIONS:  1.  Right-sided pneumonia, community-acquired pneumonia. 2.  Possible acute diastolic/systolic congestive heart failure.  3.  Hypertension.  4.  Elevated troponin, possibly due to demand ischemia from congestive heart failure and pneumonia.  5.  Dehydration.  6.  Hypokalemia.  PLAN OF TREATMENT:  1.  The patient will be admitted to telemetry floor. We will start telemonitor, start CHF protocol, monitor input and output, start Lasix 20 mg IV b.i.d. We will get an echocardiograph and a cardiology consult. Follow up troponin level. Give aspirin. The patient is 81 years old, has high risk for fall. She is not a candidate for anticoagulation but I will defer anticoagulation option to cardiologist.  2.  For pneumonia, the patient was treated with Zithromax and Rocephin. I will start Levaquin and follow up blood culture, sputum culture and CBC.  3.  For hypokalemia, I will give potassium supplement and check magnesium level.   I discussed the patient's condition and plan of treatment with the patient and the patient's son.   CODE STATUS: The patient's code status is DNR.   TIME SPENT: About 63 minutes.    ____________________________ Demetrios Loll, MD qc:TT D: 11/26/2013 18:38:15 ET T: 11/26/2013 19:05:13 ET JOB#: [id number]  cc: Demetrios Loll, MD, <Dictator> Demetrios Loll MD ELECTRONICALLY SIGNED 11/27/2013 14:31

## 2014-04-28 NOTE — Consult Note (Signed)
PATIENT NAME:  Stacey Blankenship, Stacey Blankenship MR#:  [id number] DATE OF BIRTH:  Oct 29, 1915  DATE OF CONSULTATION:  11/27/2013  REFERRING PHYSICIAN:   CONSULTING PHYSICIAN:  Riku Buttery D. Evamae Rowen, MD  INDICATIONS: Shortness of breath, congestive heart failure.   HISTORY OF PRESENT ILLNESS: The patient is a 81 year old white female with a history of hypertension and congestive heart failure who presented to the emergency room with shortness of breath. The patient was awake, alert, oriented, in no acute distress, but significantly short of breath with productive cough for the last day or so. Denied any fever. No chills, no sweats, she has had some PND and orthopnea. She has had some edema in her legs off and on. The patient does not like taking Lasix because it causes her to have frequency. She does not like that it dries out her eyes. The patient came to the Emergency Room and was found to have an abnormal chest x-ray suggestive of pneumonia, BNP of over 6000, so she was admitted for further evaluation and care. Denied any significant chest pain.   PAST MEDICAL HISTORY: Hypertension, congestive heart failure, congestive heart failure, macular degeneration, GERD.  PAST SURGICAL HISTORY: She has had back surgery.   SOCIAL HISTORY: Lives at [facility]. No smoking or alcohol consumption. Lives in assisted living.   FAMILY HISTORY: Stroke.   REVIEW OF SYSTEMS: Denies blackout spells, syncope. No nausea or vomiting. No fever, no chills, no sweats. No weight loss, no weight gain. No hemoptysis per rectum. She has had shortness of breath. She has had a productive cough, mild edema. She has had some congestion, some mild PND and orthopnea.   ALLERGIES: None.   MEDICATIONS: She is on PreserVision 1 capsule a day, Losartan    once a day, aspirin 81 mg every other day, omeprazole 20 mg a day, vitamin D 2000 units once a day, multivitamin once a day,  p.r.n., artificial tears, MiraLax as needed, timolol eye drops once a day,  Travatan eye drops at bedtime, Lasix 20 mg p.r.n. for swelling.   PHYSICAL EXAMINATION: VITAL SIGNS: Blood pressure was 170/85, pulse of 90, respiratory rate of 16, afebrile. HEENT: Normocephalic, atraumatic. Pupils equal and reactive to light.  NECK: Supple. No significant JVD, bruits or adenopathy.  LUNGS: Clear and resonant to percussion with bilateral, mild rhonchi, dullness on the right. No rales, no wheezing.  HEART: Regular rhythm, soft systolic murmur heard best at the apex. PMI is not displaced..  ABDOMEN: Benign.  EXTREMITIES: Trace edema NEUROLOGIC:  Normal.  LABORATORY DATA: Chest x-ray: Right-sided pneumonia. Troponin slightly elevated at 0.09. INR 1.1. CBC normal. Platelet count of 134, glucose of 104,  BUN of 26, creatinine 0.8, potassium 3.4, sodium of 139, chloride of 101. BNP, again, was initially elevated over 6000. EKG shows normal sinus rhythm, rate of about 90, nonspecific ST-T wave changes, LVH by voltage, right bundle branch block.   ASSESSMENT:  Right-sided pneumonia, community acquired; possible diastolic heart failure; hypertension; borderline troponins; mild dehydration and hypokalemia; mild dementia.   PLAN: Agree with admit. Place on telemetry. Follow up troponins. Follow up EKG. Continue oxygen therapy. Short-term anticoagulation for DVT prophylaxis. Recommend antibiotics for pneumonia. Continue inhalers, supplemental oxygen. Echocardiogram might prove helpful for assessment of LV function. Continue intermittent Lasix as necessary, but at this point she may be slightly dehydrated. Consider pulmonary input. Will treat the patient conservatively from a cardiac standpoint. For her potential heart failure, continue current medical therapy.   ____________________________ Loran Senters. Clayborn Bigness, MD ddc:ST D:  11/29/2013 08:10:00 ET T: 11/29/2013 09:28:20 ET JOB#: [id number]  cc: Jisselle Poth D. Clayborn Bigness, MD, <Dictator> Yolonda Kida MD ELECTRONICALLY SIGNED 12/08/2013  16:47

## 2014-04-28 NOTE — Discharge Summary (Signed)
PATIENT NAME:  Stacey Blankenship, Stacey Blankenship MR#:  [id number] DATE OF BIRTH:  [birth year]  DATE OF ADMISSION:  11/26/2013 DATE OF DISCHARGE:  11/29/2013   STAT DISCHARGE SUMMARY  PRESENTING COMPLAINT: Shortness of breath.   DISCHARGE DIAGNOSES:  1.  Acute respiratory failure with hypoxia, now resolved.  2.  Community-acquired pneumonia in the right upper and right lower lobes.  3.  Systolic heart failure, ejection fraction 25-30%.  4.  Hypokalemia.  5.  Generalized weakness.  6.  Hypertension.  7.  Macular degeneration.  8.  Gastroesophageal reflux disease.  9.  Mild dementia.   CONSULTATIONS: Dwayne D. Clayborn Bigness, MD - Cardiology.   PROCEDURES: A 2-D echocardiogram, 11/27/2013 shows left ventricular ejection fraction 30- 35%, moderately decreased, but global left ventricular systolic function, moderate aortic valve stenosis, moderate to severe tricuspid regurgitation, moderate to severe mitral valve regurgitation.   HISTORY OF PRESENT ILLNESS: This pleasant 81 year old Caucasian female with a history of hypertension and congestive heart failure presents to the Emergency Room with shortness of breath. At the time of presentation, she did have dependent edema and admitted to not using Lasix because she did not like the side effects of frequent urination and dry eyes. BNP was 6397. Chest x-ray shows right-sided pneumonia.   HOSPITAL COURSE BY PROBLEM:  1.  Community-acquired pneumonia of the right upper and lower lobes: The patient was initially treated with Zithromax and Rocephin, then transitioned to Levaquin. At the time of discharge, she is afebrile with oxygen saturations in the 90% range on room air. She had a mild increase in white blood cell count to 12.5. She was discharged on Levaquin, to complete a 7 day course.  2.  Acute on chronic systolic congestive heart failure with ejection fraction of 30-35%: Repeat 2-D echocardiogram confirms decreased ejection fraction and multiple valvular  insufficiencies during this admission. She is discharged on Coreg, daily Lasix, losartan. Troponins were mildly elevated during hospitalization, up to a high of 0.10. Cardiology was consulted, and no further ischemic work-up was recommended. She will need to follow up with her cardiologist as an outpatient. She should be on a sodium restricted diet. She should have daily weights at her new living facility.  3.  Hypokalemia: Now that she is on daily Lasix, potassium has been slightly low. She will need to continue with 40 mEq of potassium daily and have BMP checked in 1 week for further evaluation.  4.  Generalized weakness: Physical therapy saw this patient inpatient and recommends for skilled nursing facility. She is being discharged to a skilled nursing facility today for continued therapy.   DISCHARGE EXAMINATION:  VITAL SIGNS: Temperature 97.9, pulse 81, respirations 16, blood pressure 129/74, oxygenation 91% on room air.  GENERAL: No acute distress.  RESPIRATORY: Fine crackles, right greater than left. Good air movement. No respiratory distress.  CARDIOVASCULAR: Regular rate and rhythm. A 4/6 systolic ejection murmur. Trace pedal edema. Peripheral pulses are 1+.  PSYCHIATRIC: The patient is alert and oriented x 4, with good insight into her clinical condition.   LABORATORY DATA: Sodium 140, potassium 3.3 (this has been repleted) chloride 100, bicarbonate 32, BUN 42, creatinine 1.0, glucose 110, white blood cells 12.5, hemoglobin 13.3, platelets 136,000, MCV 90. Urinalysis was negative for signs of infection. Blood cultures were negative x 2.   DISCHARGE MEDICATIONS:  1.  Aspirin 81 mg 1 tablet daily on Sunday/Monday/Wednesday/Friday  at 9:00 a.m.  2.  Omeprazole 20 mg 1 capsule once a day in the morning.  3.  Vitamin  D3 1 tablet once a day in the morning.  4.  Multivitamin 1 tablet once a day in the morning.  5.  Systane ophthalmic solution 1 drop into each eye 4 times a day as needed.  6.   MiraLax oral powder 1/2 capful orally once a day.  7.  Timolol ophthalmic maleate 0.5% solution 1 drop to both eyes once a day in the morning.  8.  Travatan 0.004% ophthalmic solution 1 drop to both eyes once a day at bedtime.  9.  Refresh ophthalmic solution 1 drop to both eyes, 4 times a day as needed.  10.  Aleve 222 mg 1 tablet 2 times a day as needed for pain.  11.  Furosemide 20 mg 1 tablet once a day.  12.  Levofloxacin 250 mg 1 tablet once a day for 7 days; stopped dose 12/03/2013.  13.  Carvedilol 6.25 mg 1 tablet twice a day.  14.  Benzonatate 100 mg 1 capsule every 6 hours as needed for cough.  15.  Losartan 100 mg 1 tablet once a day.  16.  Nitroglycerin 0.4 mg 1 tablet every 5 minutes as needed for chest pain, if needed 3 times in row; please call your primary care physician.   CONDITION ON DISCHARGE: Stable.   DISPOSITION: Discharged to skilled nursing facility.   DISCHARGE INSTRUCTIONS:  DIET: Heart healthy, low sodium diet.  ACTIVITY: No restrictions; activity as tolerated.  TIMEFRAME FOR FOLLOWUP: Please follow up in 1-2 weeks with Dr. Nehemiah Massed.   TIME SPENT ON DISCHARGE: 35 minutes.     ____________________________ Earleen Newport. Volanda Napoleon, MD cpw:MT D: 11/29/2013 08:02:00 ET T: 11/29/2013 08:31:25 ET JOB#: [id number]  cc: Barnetta Chapel P. Volanda Napoleon, MD, <Dictator> Aldean Jewett MD ELECTRONICALLY SIGNED 12/09/2013 11:50

## 2014-05-06 DIAGNOSIS — M1990 Unspecified osteoarthritis, unspecified site: Secondary | ICD-10-CM | POA: Diagnosis not present

## 2014-05-06 DIAGNOSIS — M810 Age-related osteoporosis without current pathological fracture: Secondary | ICD-10-CM | POA: Diagnosis not present

## 2014-05-06 DIAGNOSIS — F419 Anxiety disorder, unspecified: Secondary | ICD-10-CM | POA: Diagnosis not present

## 2014-05-06 DIAGNOSIS — E785 Hyperlipidemia, unspecified: Secondary | ICD-10-CM | POA: Diagnosis not present

## 2014-05-06 DIAGNOSIS — I872 Venous insufficiency (chronic) (peripheral): Secondary | ICD-10-CM | POA: Diagnosis not present

## 2014-05-06 DIAGNOSIS — F0390 Unspecified dementia without behavioral disturbance: Secondary | ICD-10-CM | POA: Diagnosis not present

## 2014-05-06 DIAGNOSIS — I429 Cardiomyopathy, unspecified: Secondary | ICD-10-CM | POA: Diagnosis not present

## 2014-05-06 DIAGNOSIS — I10 Essential (primary) hypertension: Secondary | ICD-10-CM | POA: Diagnosis not present

## 2014-05-06 DIAGNOSIS — K589 Irritable bowel syndrome without diarrhea: Secondary | ICD-10-CM | POA: Diagnosis not present

## 2014-05-06 DIAGNOSIS — K219 Gastro-esophageal reflux disease without esophagitis: Secondary | ICD-10-CM | POA: Diagnosis not present

## 2014-05-06 DIAGNOSIS — E039 Hypothyroidism, unspecified: Secondary | ICD-10-CM | POA: Diagnosis not present

## 2014-05-06 DIAGNOSIS — Z6821 Body mass index (BMI) 21.0-21.9, adult: Secondary | ICD-10-CM | POA: Diagnosis not present

## 2014-05-06 DIAGNOSIS — Z87891 Personal history of nicotine dependence: Secondary | ICD-10-CM | POA: Diagnosis not present

## 2014-05-07 DIAGNOSIS — I5023 Acute on chronic systolic (congestive) heart failure: Secondary | ICD-10-CM | POA: Diagnosis not present

## 2014-05-07 DIAGNOSIS — J9620 Acute and chronic respiratory failure, unspecified whether with hypoxia or hypercapnia: Secondary | ICD-10-CM | POA: Diagnosis not present

## 2014-05-14 DIAGNOSIS — Z6821 Body mass index (BMI) 21.0-21.9, adult: Secondary | ICD-10-CM | POA: Diagnosis not present

## 2014-05-14 DIAGNOSIS — F419 Anxiety disorder, unspecified: Secondary | ICD-10-CM | POA: Diagnosis not present

## 2014-05-14 DIAGNOSIS — I429 Cardiomyopathy, unspecified: Secondary | ICD-10-CM | POA: Diagnosis not present

## 2014-05-14 DIAGNOSIS — F0390 Unspecified dementia without behavioral disturbance: Secondary | ICD-10-CM | POA: Diagnosis not present

## 2014-05-14 DIAGNOSIS — H3532 Exudative age-related macular degeneration: Secondary | ICD-10-CM | POA: Diagnosis not present

## 2014-05-29 ENCOUNTER — Encounter: Payer: Self-pay | Admitting: Internal Medicine

## 2014-06-06 DIAGNOSIS — I872 Venous insufficiency (chronic) (peripheral): Secondary | ICD-10-CM | POA: Diagnosis not present

## 2014-06-06 DIAGNOSIS — E785 Hyperlipidemia, unspecified: Secondary | ICD-10-CM | POA: Diagnosis not present

## 2014-06-06 DIAGNOSIS — K589 Irritable bowel syndrome without diarrhea: Secondary | ICD-10-CM | POA: Diagnosis not present

## 2014-06-06 DIAGNOSIS — M810 Age-related osteoporosis without current pathological fracture: Secondary | ICD-10-CM | POA: Diagnosis not present

## 2014-06-06 DIAGNOSIS — E039 Hypothyroidism, unspecified: Secondary | ICD-10-CM | POA: Diagnosis not present

## 2014-06-06 DIAGNOSIS — F419 Anxiety disorder, unspecified: Secondary | ICD-10-CM | POA: Diagnosis not present

## 2014-06-06 DIAGNOSIS — M1990 Unspecified osteoarthritis, unspecified site: Secondary | ICD-10-CM | POA: Diagnosis not present

## 2014-06-06 DIAGNOSIS — Z87891 Personal history of nicotine dependence: Secondary | ICD-10-CM | POA: Diagnosis not present

## 2014-06-06 DIAGNOSIS — F0390 Unspecified dementia without behavioral disturbance: Secondary | ICD-10-CM | POA: Diagnosis not present

## 2014-06-06 DIAGNOSIS — Z6821 Body mass index (BMI) 21.0-21.9, adult: Secondary | ICD-10-CM | POA: Diagnosis not present

## 2014-06-06 DIAGNOSIS — K219 Gastro-esophageal reflux disease without esophagitis: Secondary | ICD-10-CM | POA: Diagnosis not present

## 2014-06-06 DIAGNOSIS — I429 Cardiomyopathy, unspecified: Secondary | ICD-10-CM | POA: Diagnosis not present

## 2014-06-06 DIAGNOSIS — I10 Essential (primary) hypertension: Secondary | ICD-10-CM | POA: Diagnosis not present

## 2014-06-13 ENCOUNTER — Ambulatory Visit (INDEPENDENT_AMBULATORY_CARE_PROVIDER_SITE_OTHER): Payer: Medicare Other | Admitting: Family Medicine

## 2014-06-13 ENCOUNTER — Encounter: Payer: Self-pay | Admitting: Family Medicine

## 2014-06-13 ENCOUNTER — Encounter: Payer: Self-pay | Admitting: Internal Medicine

## 2014-06-13 VITALS — BP 144/72 | HR 60 | Temp 98.0°F

## 2014-06-13 DIAGNOSIS — Z01419 Encounter for gynecological examination (general) (routine) without abnormal findings: Secondary | ICD-10-CM

## 2014-06-13 DIAGNOSIS — E039 Hypothyroidism, unspecified: Secondary | ICD-10-CM | POA: Diagnosis not present

## 2014-06-13 DIAGNOSIS — F411 Generalized anxiety disorder: Secondary | ICD-10-CM | POA: Diagnosis not present

## 2014-06-13 DIAGNOSIS — I5022 Chronic systolic (congestive) heart failure: Secondary | ICD-10-CM

## 2014-06-13 DIAGNOSIS — K5900 Constipation, unspecified: Secondary | ICD-10-CM | POA: Diagnosis not present

## 2014-06-13 MED ORDER — ALPRAZOLAM 0.25 MG PO TABS: ORAL_TABLET | ORAL | Status: AC

## 2014-06-13 NOTE — Progress Notes (Signed)
Pre visit review using our clinic review tool, if applicable. No additional management support is needed unless otherwise documented below in the visit note. 

## 2014-06-13 NOTE — Assessment & Plan Note (Signed)
>  25 minutes spent in face to face time with patient, >50% spent in counselling or coordination of care Improve.d  She wants to decrease dose of xanax at night.  Advised that she may become more SOB and anxious if we do this but she would like to try.  Rx changed to 0.25 mg xanax qhs, continue am and prn dosing. Call or return to clinic prn if these symptoms worsen or fail to improve as anticipated. The patient indicates understanding of these issues and agrees with the plan.

## 2014-06-13 NOTE — Progress Notes (Signed)
Subjective:   Patient ID: Stacey Blankenship, female    DOB: 01-14-1915, 81 y.o.   MRN: [id number]  Stacey Blankenship is a pleasant 81 y.o. year old female  with complicated medical history, who presents to clinic today with her son Stacey Blankenship, to Follow-up and Gas  on 06/13/2014  HPI:   Stacey Blankenship has moved to health care facility due to increased care needs, also now on hospice. She has CHF with recent progression of symptoms (followed by Dr. Nehemiah Massed).  She feels she has been doing quite well.  Improved anxiety on increased dose of celexa.  She wants to take lower dose of xanax at night--feels it is "too powerful."  Feels groggy in the morning.  Takes 1/2 of a 0.25 mg dose of xanax in the morning too.   Lab Results  Component Value Date   CREATININE 1.00 11/28/2013   Lab Results  Component Value Date   WBC 12.5* 11/27/2013   HGB 13.3 11/27/2013   HCT 39.9 11/27/2013   MCV 90 11/27/2013   PLT 136* 11/27/2013   Lab Results  Component Value Date   NA 140 11/28/2013   K 3.3* 11/28/2013   CL 100 11/28/2013   CO2 32 11/28/2013   No results found for: CHOL, HDL, LDLCALC, LDLDIRECT, TRIG, CHOLHDL   Current Outpatient Prescriptions on File Prior to Visit  Medication Sig Dispense Refill  . aspirin EC 81 MG tablet TAKE ONE TABLET EVERY DAY AFTER BREAFKAST ON SUNDAY, MON., WED. AND FRIDAY ONLY (YELLOW #121) PLACE IN 9AM SLOT 22 tablet 11  . carvedilol (COREG) 3.125 MG tablet Take 3.125 mg by mouth 2 (two) times daily with a meal.    . Cholecalciferol (VITAMIN D) 1000 UNITS capsule TAKE ONE GEL CAPSULE BY MOUTH EVERY DAY AFTER BREAKST (LIGHT YELLOW GEL CAP) PLACE IN 9AM SLOT 30 capsule 11  . furosemide (LASIX) 20 MG tablet Take 20 mg by mouth daily as needed.    Marland Kitchen losartan-hydrochlorothiazide (HYZAAR) 100-25 MG per tablet TAKE ONE TABLET BY MOUTH EVERY DAY AFTER BREAKFAST (YELLOW #7368) 30 tablet 11  . Multiple Vitamin (THEREMS) TABS TAKE ONE TABLET BY MOUTH EACH DAY. 30 tablet  11  . Multiple Vitamins-Minerals (PRESERVISION AREDS 2) CAPS TAKE ONE GEL CAPSULE BY MOUTH EVERY DAY AFTER BREAKFAST (BROWN GEL CAP) 30 capsule 11  . naproxen sodium (ANAPROX) 220 MG tablet Take 220 mg by mouth every morning. And 1 in evening prn    . omeprazole (PRILOSEC) 20 MG capsule TAKE ONE CAPSULE BY MOUTH EVERY DAY BEFORE BREAKFAST 30 capsule 11  . polyethylene glycol powder (MIRALAX) powder Take 17 g by mouth daily. 527 g 1  . risperiDONE (RISPERDAL) 0.25 MG tablet Take 1 tablet (0.25 mg total) by mouth at bedtime. 30 tablet 3  . timolol (BETIMOL) 0.5 % ophthalmic solution Place 1 drop into both eyes daily.    . traMADol (ULTRAM) 50 MG tablet 1/2- 1 tab po twice daily prn pain 30 tablet 0  . travoprost, benzalkonium, (TRAVATAN) 0.004 % ophthalmic solution Place 1 drop into both eyes at bedtime.     No current facility-administered medications on file prior to visit.    No Known Allergies  Past Medical History  Diagnosis Date  . Allergy   . GERD (gastroesophageal reflux disease)   . Hyperlipidemia   . Hypertension   . Hyperthyroidism   . Osteoporosis   . IBS (irritable bowel syndrome)   . Bundle branch block   . Chronic venous  insufficiency   . Systolic CHF, chronic     severe MR  . Pulmonary hypertension     Past Surgical History  Procedure Laterality Date  . Tonsillectomy and adenoidectomy    . Appendectomy  01/05/1918  . Pneumonia  01/05/1990  . Back surgery  01/05/2002  . Ablation saphenous vein w/ rfa  01/05/2006    No family history on file.  History   Social History  . Marital Status: Widowed    Spouse Name: N/A  . Number of Children: 2  . Years of Education: N/A   Occupational History  . Retired Education officer, [employer]    Social History Main Topics  . Smoking status: Former Smoker    Quit date: 02/18/2005  . Smokeless tobacco: Never Used  . Alcohol Use: Yes  . Drug Use: Not on file  . Sexual Activity: Not on file   Other Topics Concern  . Not on  file   Social History Narrative   Has living will   Daughter Stacey Blankenship is her health care POA   Has DNR and reviewed this order   Would not want tube feeds if cognitively unaware   The PMH, PSH, Social History, Family History, Medications, and allergies have been reviewed in [facility], and have been updated if relevant.   Review of Systems  Constitutional: Positive for appetite change.  HENT: Negative.   Respiratory: Positive for shortness of breath.   Cardiovascular: Negative for chest pain.  Gastrointestinal: Positive for diarrhea. Negative for abdominal pain and abdominal distention.  Endocrine: Negative.   Genitourinary: Negative.   Musculoskeletal: Negative.   Skin: Negative.   Allergic/Immunologic: Negative.   Neurological: Negative.   Hematological: Negative.   Psychiatric/Behavioral: Negative for suicidal ideas, sleep disturbance and self-injury. The patient is nervous/anxious.   All other systems reviewed and are negative.      Objective:    BP 144/72 mmHg  Pulse 60  Temp(Src) 98 F (36.7 C) (Oral)  SpO2 95% BP Readings from Last 3 Encounters:  06/13/14 144/72  04/19/14 136/78  03/16/14 122/80   Wt Readings from Last 3 Encounters:  04/19/14 117 lb (53.071 kg)  03/16/14 118 lb (53.524 kg)  02/28/14 119 lb (53.978 kg)     Physical Exam  Constitutional: She is oriented to person, place, and time. She appears well-developed and well-nourished. No distress.  In wheelchair Frail, pleasant  HENT:  Head: Normocephalic.  Eyes: Conjunctivae are normal.  Neck: Normal range of motion.  Cardiovascular:  Murmur heard. Pulmonary/Chest: Effort normal. No respiratory distress. She has no wheezes.  Abdominal: Soft.  Musculoskeletal:  Trace LE edema bilaterally  Neurological: She is alert and oriented to person, place, and time. No cranial nerve deficit.  Skin: Skin is warm and dry.  Psychiatric: She has a normal mood and affect. Her behavior is normal. Judgment and thought  content normal.  Nursing note and vitals reviewed.         Assessment & Plan:   Constipation, unspecified constipation type  Well woman exam - Plan: CANCELED: Cytology - [facility] [place]  Anxiety state  Hypothyroidism, unspecified hypothyroidism type No Follow-up on file.

## 2014-06-13 NOTE — Assessment & Plan Note (Signed)
On hospice. Exam and VSS reassuring. No changes made.

## 2014-06-15 ENCOUNTER — Encounter: Payer: Self-pay | Admitting: Family Medicine

## 2014-06-18 ENCOUNTER — Ambulatory Visit: Payer: Medicare Other

## 2014-06-20 DIAGNOSIS — B029 Zoster without complications: Secondary | ICD-10-CM | POA: Diagnosis not present

## 2014-06-25 ENCOUNTER — Telehealth: Payer: Self-pay

## 2014-06-25 NOTE — Telephone Encounter (Signed)
Please call to check on pt. 

## 2014-06-25 NOTE — Telephone Encounter (Signed)
Spoke to pts daughter who states that pt was seen by Dr Silvio Pate this am and is requesting he speak with you regarding details. Baity advised pt last week that she had shingles, but Dr Silvio Pate states pt did not have shingles. Pt is no longer being quarantined.

## 2014-06-25 NOTE — Telephone Encounter (Signed)
Spoke with patient's daughter.  Questions answered.  She is wanting to transfer back to Dr. Silvio Pate because it is easier for to be seen at [facility].

## 2014-06-25 NOTE — Telephone Encounter (Signed)
PLEASE NOTE: All timestamps contained within this report are represented as [place] Standard Time. CONFIDENTIALTY NOTICE: This fax transmission is intended only for the addressee. It contains information that is legally privileged, confidential or otherwise protected from use or disclosure. If you are not the intended recipient, you are strictly prohibited from reviewing, disclosing, copying using or disseminating any of this information or taking any action in reliance on or regarding this information. If you have received this fax in error, please notify us immediately by telephone so that we can arrange for its return to [place]. Phone: [phone], Toll-Free: [phone], Fax: [phone] Page: 1 of 1 Call Id: [id number] Bryce Patient Name: ABBIEGAIL LANDGREN Gender: Female DOB: 1915/04/16 Age: 81 Y 1 M 27 D Return Phone Number: Address: City/State/Zip:  Client [place] Night - Client Client Site [place] Physician Arnette Norris Contact Type Call Call Type Triage / Clinical Caller Name Betsy Relationship To Patient Other Return Phone Number Please choose phone number Chief Complaint Paging or Request for Consult Initial Comment Caller needs to get a message to a provider. Gwinda Passe CB# [phone] [facility], the son would like his mother seen by Dr. Deborra Medina by Monday.She is a resident there. Nurse Assessment Nurse: Malva Cogan, RN, Juliann Pulse Date/Time [name] Time): 06/23/2014 2:16:42 PM Confirm and document reason for call. If symptomatic, describe symptoms. ---Caller states that she is [facility] & reports that pt was seen by NP on Wednesday & diagnosed with shingles & son of pt is wanting msg sent to Dr. Deborra Medina that he wants Dr. Deborra Medina to see pt by Monday to confirm that pt does have shingles. Advised caller that Dr. Deborra Medina  is not on call today & that msg can be sent to office that caller called & son's request but that triager does not know what happens to msgs sent to office once they are sent to office. Has the patient traveled out of the country within the last 30 days? ---No Does the patient require triage? ---No Please document clinical information provided and list any resource used. ---See previous note. Guidelines Guideline Title Affirmed Question Affirmed Notes Nurse Date/Time (Eastern Time) Disp. Time [name] Time) Disposition Final User 06/23/2014 2:19:06 PM Clinical Call Yes Malva Cogan, RN, Juliann Pulse After Care Instructions Given Call Event Type User Date / Time Description

## 2014-06-29 DIAGNOSIS — H3531 Nonexudative age-related macular degeneration: Secondary | ICD-10-CM | POA: Diagnosis not present

## 2014-06-29 DIAGNOSIS — H3532 Exudative age-related macular degeneration: Secondary | ICD-10-CM | POA: Diagnosis not present

## 2014-07-06 DIAGNOSIS — F0390 Unspecified dementia without behavioral disturbance: Secondary | ICD-10-CM | POA: Diagnosis not present

## 2014-07-06 DIAGNOSIS — I10 Essential (primary) hypertension: Secondary | ICD-10-CM | POA: Diagnosis not present

## 2014-07-06 DIAGNOSIS — E785 Hyperlipidemia, unspecified: Secondary | ICD-10-CM | POA: Diagnosis not present

## 2014-07-06 DIAGNOSIS — K589 Irritable bowel syndrome without diarrhea: Secondary | ICD-10-CM | POA: Diagnosis not present

## 2014-07-06 DIAGNOSIS — K219 Gastro-esophageal reflux disease without esophagitis: Secondary | ICD-10-CM | POA: Diagnosis not present

## 2014-07-06 DIAGNOSIS — F419 Anxiety disorder, unspecified: Secondary | ICD-10-CM | POA: Diagnosis not present

## 2014-07-06 DIAGNOSIS — I872 Venous insufficiency (chronic) (peripheral): Secondary | ICD-10-CM | POA: Diagnosis not present

## 2014-07-06 DIAGNOSIS — Z87891 Personal history of nicotine dependence: Secondary | ICD-10-CM | POA: Diagnosis not present

## 2014-07-06 DIAGNOSIS — M810 Age-related osteoporosis without current pathological fracture: Secondary | ICD-10-CM | POA: Diagnosis not present

## 2014-07-06 DIAGNOSIS — Z6821 Body mass index (BMI) 21.0-21.9, adult: Secondary | ICD-10-CM | POA: Diagnosis not present

## 2014-07-06 DIAGNOSIS — I429 Cardiomyopathy, unspecified: Secondary | ICD-10-CM | POA: Diagnosis not present

## 2014-07-06 DIAGNOSIS — E039 Hypothyroidism, unspecified: Secondary | ICD-10-CM | POA: Diagnosis not present

## 2014-07-06 DIAGNOSIS — M1990 Unspecified osteoarthritis, unspecified site: Secondary | ICD-10-CM | POA: Diagnosis not present

## 2014-07-12 DIAGNOSIS — F0150 Vascular dementia without behavioral disturbance: Secondary | ICD-10-CM | POA: Diagnosis not present

## 2014-07-12 DIAGNOSIS — I270 Primary pulmonary hypertension: Secondary | ICD-10-CM | POA: Diagnosis not present

## 2014-07-12 DIAGNOSIS — H4011X0 Primary open-angle glaucoma, stage unspecified: Secondary | ICD-10-CM | POA: Diagnosis not present

## 2014-07-12 DIAGNOSIS — I5022 Chronic systolic (congestive) heart failure: Secondary | ICD-10-CM | POA: Diagnosis not present

## 2014-07-12 DIAGNOSIS — F39 Unspecified mood [affective] disorder: Secondary | ICD-10-CM | POA: Diagnosis not present

## 2014-08-06 DIAGNOSIS — M810 Age-related osteoporosis without current pathological fracture: Secondary | ICD-10-CM | POA: Diagnosis not present

## 2014-08-06 DIAGNOSIS — K589 Irritable bowel syndrome without diarrhea: Secondary | ICD-10-CM | POA: Diagnosis not present

## 2014-08-06 DIAGNOSIS — I10 Essential (primary) hypertension: Secondary | ICD-10-CM | POA: Diagnosis not present

## 2014-08-06 DIAGNOSIS — F0390 Unspecified dementia without behavioral disturbance: Secondary | ICD-10-CM | POA: Diagnosis not present

## 2014-08-06 DIAGNOSIS — M1990 Unspecified osteoarthritis, unspecified site: Secondary | ICD-10-CM | POA: Diagnosis not present

## 2014-08-06 DIAGNOSIS — E785 Hyperlipidemia, unspecified: Secondary | ICD-10-CM | POA: Diagnosis not present

## 2014-08-06 DIAGNOSIS — I429 Cardiomyopathy, unspecified: Secondary | ICD-10-CM | POA: Diagnosis not present

## 2014-08-06 DIAGNOSIS — K219 Gastro-esophageal reflux disease without esophagitis: Secondary | ICD-10-CM | POA: Diagnosis not present

## 2014-08-06 DIAGNOSIS — F419 Anxiety disorder, unspecified: Secondary | ICD-10-CM | POA: Diagnosis not present

## 2014-08-06 DIAGNOSIS — Z6821 Body mass index (BMI) 21.0-21.9, adult: Secondary | ICD-10-CM | POA: Diagnosis not present

## 2014-08-06 DIAGNOSIS — I872 Venous insufficiency (chronic) (peripheral): Secondary | ICD-10-CM | POA: Diagnosis not present

## 2014-08-06 DIAGNOSIS — E039 Hypothyroidism, unspecified: Secondary | ICD-10-CM | POA: Diagnosis not present

## 2014-08-06 DIAGNOSIS — Z87891 Personal history of nicotine dependence: Secondary | ICD-10-CM | POA: Diagnosis not present

## 2014-08-27 DIAGNOSIS — J398 Other specified diseases of upper respiratory tract: Secondary | ICD-10-CM | POA: Diagnosis not present

## 2014-08-29 DIAGNOSIS — H3532 Exudative age-related macular degeneration: Secondary | ICD-10-CM | POA: Diagnosis not present

## 2014-09-06 DIAGNOSIS — E039 Hypothyroidism, unspecified: Secondary | ICD-10-CM | POA: Diagnosis not present

## 2014-09-06 DIAGNOSIS — K219 Gastro-esophageal reflux disease without esophagitis: Secondary | ICD-10-CM | POA: Diagnosis not present

## 2014-09-06 DIAGNOSIS — I429 Cardiomyopathy, unspecified: Secondary | ICD-10-CM | POA: Diagnosis not present

## 2014-09-06 DIAGNOSIS — M810 Age-related osteoporosis without current pathological fracture: Secondary | ICD-10-CM | POA: Diagnosis not present

## 2014-09-06 DIAGNOSIS — I872 Venous insufficiency (chronic) (peripheral): Secondary | ICD-10-CM | POA: Diagnosis not present

## 2014-09-06 DIAGNOSIS — Z87891 Personal history of nicotine dependence: Secondary | ICD-10-CM | POA: Diagnosis not present

## 2014-09-06 DIAGNOSIS — E785 Hyperlipidemia, unspecified: Secondary | ICD-10-CM | POA: Diagnosis not present

## 2014-09-06 DIAGNOSIS — F419 Anxiety disorder, unspecified: Secondary | ICD-10-CM | POA: Diagnosis not present

## 2014-09-06 DIAGNOSIS — K589 Irritable bowel syndrome without diarrhea: Secondary | ICD-10-CM | POA: Diagnosis not present

## 2014-09-06 DIAGNOSIS — M1990 Unspecified osteoarthritis, unspecified site: Secondary | ICD-10-CM | POA: Diagnosis not present

## 2014-09-06 DIAGNOSIS — Z6821 Body mass index (BMI) 21.0-21.9, adult: Secondary | ICD-10-CM | POA: Diagnosis not present

## 2014-09-06 DIAGNOSIS — F0390 Unspecified dementia without behavioral disturbance: Secondary | ICD-10-CM | POA: Diagnosis not present

## 2014-09-06 DIAGNOSIS — I10 Essential (primary) hypertension: Secondary | ICD-10-CM | POA: Diagnosis not present

## 2014-09-07 DIAGNOSIS — I5020 Unspecified systolic (congestive) heart failure: Secondary | ICD-10-CM | POA: Diagnosis not present

## 2014-09-07 DIAGNOSIS — F0150 Vascular dementia without behavioral disturbance: Secondary | ICD-10-CM | POA: Diagnosis not present

## 2014-09-07 DIAGNOSIS — F39 Unspecified mood [affective] disorder: Secondary | ICD-10-CM

## 2014-09-07 DIAGNOSIS — K219 Gastro-esophageal reflux disease without esophagitis: Secondary | ICD-10-CM | POA: Diagnosis not present

## 2014-09-07 DIAGNOSIS — E039 Hypothyroidism, unspecified: Secondary | ICD-10-CM | POA: Diagnosis not present

## 2014-10-06 DIAGNOSIS — E039 Hypothyroidism, unspecified: Secondary | ICD-10-CM | POA: Diagnosis not present

## 2014-10-06 DIAGNOSIS — K219 Gastro-esophageal reflux disease without esophagitis: Secondary | ICD-10-CM | POA: Diagnosis not present

## 2014-10-06 DIAGNOSIS — F0390 Unspecified dementia without behavioral disturbance: Secondary | ICD-10-CM | POA: Diagnosis not present

## 2014-10-06 DIAGNOSIS — M810 Age-related osteoporosis without current pathological fracture: Secondary | ICD-10-CM | POA: Diagnosis not present

## 2014-10-06 DIAGNOSIS — K589 Irritable bowel syndrome without diarrhea: Secondary | ICD-10-CM | POA: Diagnosis not present

## 2014-10-06 DIAGNOSIS — Z87891 Personal history of nicotine dependence: Secondary | ICD-10-CM | POA: Diagnosis not present

## 2014-10-06 DIAGNOSIS — F419 Anxiety disorder, unspecified: Secondary | ICD-10-CM | POA: Diagnosis not present

## 2014-10-06 DIAGNOSIS — Z6821 Body mass index (BMI) 21.0-21.9, adult: Secondary | ICD-10-CM | POA: Diagnosis not present

## 2014-10-06 DIAGNOSIS — I10 Essential (primary) hypertension: Secondary | ICD-10-CM | POA: Diagnosis not present

## 2014-10-06 DIAGNOSIS — I429 Cardiomyopathy, unspecified: Secondary | ICD-10-CM | POA: Diagnosis not present

## 2014-10-06 DIAGNOSIS — M1990 Unspecified osteoarthritis, unspecified site: Secondary | ICD-10-CM | POA: Diagnosis not present

## 2014-10-06 DIAGNOSIS — I872 Venous insufficiency (chronic) (peripheral): Secondary | ICD-10-CM | POA: Diagnosis not present

## 2014-10-06 DIAGNOSIS — E785 Hyperlipidemia, unspecified: Secondary | ICD-10-CM | POA: Diagnosis not present

## 2014-10-08 DIAGNOSIS — I429 Cardiomyopathy, unspecified: Secondary | ICD-10-CM | POA: Diagnosis not present

## 2014-10-08 DIAGNOSIS — Z6821 Body mass index (BMI) 21.0-21.9, adult: Secondary | ICD-10-CM | POA: Diagnosis not present

## 2014-10-08 DIAGNOSIS — F419 Anxiety disorder, unspecified: Secondary | ICD-10-CM | POA: Diagnosis not present

## 2014-10-08 DIAGNOSIS — F0390 Unspecified dementia without behavioral disturbance: Secondary | ICD-10-CM | POA: Diagnosis not present

## 2014-10-10 DIAGNOSIS — H353212 Exudative age-related macular degeneration, right eye, with inactive choroidal neovascularization: Secondary | ICD-10-CM | POA: Diagnosis not present

## 2014-11-01 ENCOUNTER — Ambulatory Visit (INDEPENDENT_AMBULATORY_CARE_PROVIDER_SITE_OTHER): Payer: Medicare Other | Admitting: Podiatry

## 2014-11-01 DIAGNOSIS — B351 Tinea unguium: Secondary | ICD-10-CM

## 2014-11-01 DIAGNOSIS — M79676 Pain in unspecified toe(s): Secondary | ICD-10-CM

## 2014-11-01 NOTE — Progress Notes (Signed)
Patient ID: Stacey Blankenship, female   DOB: 03/01/1915, 81 y.o.   MRN: [id number]  Subjective: 81 y.o.-year-old female returns the office today for painful, elongated, thickened toenails which she is unable to trim herself.  Denies any acute changes since last appointment and no new complaints today. Denies any systemic complaints such as fevers, chills, nausea, vomiting.   Objective: AAO 3, NAD DP/PT pulses palpable, CRT less than 3 seconds Protective sensation intact with Simms Weinstein monofilament, Achilles tendon reflex intact.  Nails hypertrophic, dystrophic, elongated, brittle, discolored 10. There is tenderness overlying the nails 1-5 bilaterally. There is no surrounding erythema or drainage along the nail sites. Wound to the left leg has healed. No open lesions or pre-ulcerative lesions bilaterally at this time.  No other areas of tenderness bilateral lower extremities. No overlying edema, erythema, increased warmth. No pain with calf compression, swelling, warmth, erythema.  Assessment: Patient presents with symptomatic onychomycosis  Plan: -Treatment options including alternatives, risks, complications were discussed -Nails sharply debrided 10 without complication/bleeding. -Discussed daily foot inspection. If there are any changes, to call the office immediately.  -Follow-up in 3 months or sooner if any problems are to arise. In the meantime, encouraged to call the office with any questions, concerns, changes symptoms.  Celesta Gentile, DPM

## 2014-11-06 DIAGNOSIS — F419 Anxiety disorder, unspecified: Secondary | ICD-10-CM | POA: Diagnosis not present

## 2014-11-06 DIAGNOSIS — K219 Gastro-esophageal reflux disease without esophagitis: Secondary | ICD-10-CM | POA: Diagnosis not present

## 2014-11-06 DIAGNOSIS — Z6821 Body mass index (BMI) 21.0-21.9, adult: Secondary | ICD-10-CM | POA: Diagnosis not present

## 2014-11-06 DIAGNOSIS — I429 Cardiomyopathy, unspecified: Secondary | ICD-10-CM | POA: Diagnosis not present

## 2014-11-06 DIAGNOSIS — F39 Unspecified mood [affective] disorder: Secondary | ICD-10-CM

## 2014-11-06 DIAGNOSIS — I872 Venous insufficiency (chronic) (peripheral): Secondary | ICD-10-CM | POA: Diagnosis not present

## 2014-11-06 DIAGNOSIS — M1990 Unspecified osteoarthritis, unspecified site: Secondary | ICD-10-CM | POA: Diagnosis not present

## 2014-11-06 DIAGNOSIS — Z87891 Personal history of nicotine dependence: Secondary | ICD-10-CM | POA: Diagnosis not present

## 2014-11-06 DIAGNOSIS — I350 Nonrheumatic aortic (valve) stenosis: Secondary | ICD-10-CM | POA: Diagnosis not present

## 2014-11-06 DIAGNOSIS — I270 Primary pulmonary hypertension: Secondary | ICD-10-CM | POA: Diagnosis not present

## 2014-11-06 DIAGNOSIS — F0150 Vascular dementia without behavioral disturbance: Secondary | ICD-10-CM | POA: Diagnosis not present

## 2014-11-06 DIAGNOSIS — E039 Hypothyroidism, unspecified: Secondary | ICD-10-CM | POA: Diagnosis not present

## 2014-11-06 DIAGNOSIS — K589 Irritable bowel syndrome without diarrhea: Secondary | ICD-10-CM | POA: Diagnosis not present

## 2014-11-06 DIAGNOSIS — M810 Age-related osteoporosis without current pathological fracture: Secondary | ICD-10-CM | POA: Diagnosis not present

## 2014-11-06 DIAGNOSIS — F0390 Unspecified dementia without behavioral disturbance: Secondary | ICD-10-CM | POA: Diagnosis not present

## 2014-11-06 DIAGNOSIS — I10 Essential (primary) hypertension: Secondary | ICD-10-CM | POA: Diagnosis not present

## 2014-11-06 DIAGNOSIS — E785 Hyperlipidemia, unspecified: Secondary | ICD-10-CM | POA: Diagnosis not present

## 2014-11-06 DIAGNOSIS — I5022 Chronic systolic (congestive) heart failure: Secondary | ICD-10-CM | POA: Diagnosis not present

## 2014-11-21 DIAGNOSIS — H353212 Exudative age-related macular degeneration, right eye, with inactive choroidal neovascularization: Secondary | ICD-10-CM | POA: Diagnosis not present

## 2014-12-06 DIAGNOSIS — Z6821 Body mass index (BMI) 21.0-21.9, adult: Secondary | ICD-10-CM | POA: Diagnosis not present

## 2014-12-06 DIAGNOSIS — I10 Essential (primary) hypertension: Secondary | ICD-10-CM | POA: Diagnosis not present

## 2014-12-06 DIAGNOSIS — I429 Cardiomyopathy, unspecified: Secondary | ICD-10-CM | POA: Diagnosis not present

## 2014-12-06 DIAGNOSIS — E785 Hyperlipidemia, unspecified: Secondary | ICD-10-CM | POA: Diagnosis not present

## 2014-12-06 DIAGNOSIS — I872 Venous insufficiency (chronic) (peripheral): Secondary | ICD-10-CM | POA: Diagnosis not present

## 2014-12-06 DIAGNOSIS — E039 Hypothyroidism, unspecified: Secondary | ICD-10-CM | POA: Diagnosis not present

## 2014-12-06 DIAGNOSIS — Z87891 Personal history of nicotine dependence: Secondary | ICD-10-CM | POA: Diagnosis not present

## 2014-12-06 DIAGNOSIS — K219 Gastro-esophageal reflux disease without esophagitis: Secondary | ICD-10-CM | POA: Diagnosis not present

## 2014-12-06 DIAGNOSIS — F0390 Unspecified dementia without behavioral disturbance: Secondary | ICD-10-CM | POA: Diagnosis not present

## 2014-12-06 DIAGNOSIS — M1990 Unspecified osteoarthritis, unspecified site: Secondary | ICD-10-CM | POA: Diagnosis not present

## 2014-12-06 DIAGNOSIS — F419 Anxiety disorder, unspecified: Secondary | ICD-10-CM | POA: Diagnosis not present

## 2014-12-06 DIAGNOSIS — K589 Irritable bowel syndrome without diarrhea: Secondary | ICD-10-CM | POA: Diagnosis not present

## 2014-12-06 DIAGNOSIS — M810 Age-related osteoporosis without current pathological fracture: Secondary | ICD-10-CM | POA: Diagnosis not present

## 2015-01-06 DIAGNOSIS — F419 Anxiety disorder, unspecified: Secondary | ICD-10-CM | POA: Diagnosis not present

## 2015-01-06 DIAGNOSIS — M810 Age-related osteoporosis without current pathological fracture: Secondary | ICD-10-CM | POA: Diagnosis not present

## 2015-01-06 DIAGNOSIS — K219 Gastro-esophageal reflux disease without esophagitis: Secondary | ICD-10-CM | POA: Diagnosis not present

## 2015-01-06 DIAGNOSIS — Z87891 Personal history of nicotine dependence: Secondary | ICD-10-CM | POA: Diagnosis not present

## 2015-01-06 DIAGNOSIS — F0390 Unspecified dementia without behavioral disturbance: Secondary | ICD-10-CM | POA: Diagnosis not present

## 2015-01-06 DIAGNOSIS — I429 Cardiomyopathy, unspecified: Secondary | ICD-10-CM | POA: Diagnosis not present

## 2015-01-06 DIAGNOSIS — I872 Venous insufficiency (chronic) (peripheral): Secondary | ICD-10-CM | POA: Diagnosis not present

## 2015-01-06 DIAGNOSIS — E785 Hyperlipidemia, unspecified: Secondary | ICD-10-CM | POA: Diagnosis not present

## 2015-01-06 DIAGNOSIS — E039 Hypothyroidism, unspecified: Secondary | ICD-10-CM | POA: Diagnosis not present

## 2015-01-06 DIAGNOSIS — Z6821 Body mass index (BMI) 21.0-21.9, adult: Secondary | ICD-10-CM | POA: Diagnosis not present

## 2015-01-06 DIAGNOSIS — M1990 Unspecified osteoarthritis, unspecified site: Secondary | ICD-10-CM | POA: Diagnosis not present

## 2015-01-06 DIAGNOSIS — K589 Irritable bowel syndrome without diarrhea: Secondary | ICD-10-CM | POA: Diagnosis not present

## 2015-01-06 DIAGNOSIS — I10 Essential (primary) hypertension: Secondary | ICD-10-CM | POA: Diagnosis not present

## 2015-01-09 DIAGNOSIS — H353212 Exudative age-related macular degeneration, right eye, with inactive choroidal neovascularization: Secondary | ICD-10-CM | POA: Diagnosis not present

## 2015-01-16 DIAGNOSIS — K219 Gastro-esophageal reflux disease without esophagitis: Secondary | ICD-10-CM | POA: Diagnosis not present

## 2015-01-16 DIAGNOSIS — I350 Nonrheumatic aortic (valve) stenosis: Secondary | ICD-10-CM

## 2015-01-16 DIAGNOSIS — I270 Primary pulmonary hypertension: Secondary | ICD-10-CM | POA: Diagnosis not present

## 2015-01-16 DIAGNOSIS — E039 Hypothyroidism, unspecified: Secondary | ICD-10-CM | POA: Diagnosis not present

## 2015-01-16 DIAGNOSIS — I5020 Unspecified systolic (congestive) heart failure: Secondary | ICD-10-CM | POA: Diagnosis not present

## 2015-01-16 DIAGNOSIS — F0150 Vascular dementia without behavioral disturbance: Secondary | ICD-10-CM

## 2015-01-25 DIAGNOSIS — H353211 Exudative age-related macular degeneration, right eye, with active choroidal neovascularization: Secondary | ICD-10-CM | POA: Diagnosis not present

## 2015-01-31 ENCOUNTER — Ambulatory Visit (INDEPENDENT_AMBULATORY_CARE_PROVIDER_SITE_OTHER): Payer: Medicare Other | Admitting: Podiatry

## 2015-01-31 ENCOUNTER — Ambulatory Visit: Payer: Medicare Other

## 2015-01-31 ENCOUNTER — Encounter: Payer: Self-pay | Admitting: Podiatry

## 2015-01-31 DIAGNOSIS — B351 Tinea unguium: Secondary | ICD-10-CM

## 2015-01-31 DIAGNOSIS — M79676 Pain in unspecified toe(s): Secondary | ICD-10-CM

## 2015-01-31 NOTE — Progress Notes (Signed)
Patient ID: Stacey Blankenship, female   DOB: 03/16/1915, 82 y.o.   MRN: [id number]   Subjective: 82 y.o.-year-old female returns the office today for painful, elongated, thickened toenails which she is unable to trim herself.  Denies any acute changes since last appointment and no new complaints today. Denies any systemic complaints such as fevers, chills, nausea, vomiting.   Objective: AAO 3, NAD DP/PT pulses palpable, CRT less than 3 seconds Protective sensation intact with Simms Weinstein monofilament, Achilles tendon reflex intact.  Nails hypertrophic, dystrophic, elongated, brittle, discolored 10. There is tenderness overlying the nails 1-5 bilaterally. There is no surrounding erythema or drainage along the nail sites. Small amount of dried blood under the right 4th toenail.  No other areas of tenderness bilateral lower extremities. No overlying edema, erythema, increased warmth. No pain with calf compression, swelling, warmth, erythema.  Assessment: Patient presents with symptomatic onychomycosis  Plan: -Treatment options including alternatives, risks, complications were discussed -Nails sharply debrided 10 without complication/bleeding. -Monitor blood under right 4th toenail. If worsening or s/s of infection to call the office.  -Discussed daily foot inspection. If there are any changes, to call the office immediately.  -Follow-up in 3 months or sooner if any problems are to arise. In the meantime, encouraged to call the office with any questions, concerns, changes symptoms.  Celesta Gentile, DPM

## 2015-02-04 DIAGNOSIS — F0390 Unspecified dementia without behavioral disturbance: Secondary | ICD-10-CM | POA: Diagnosis not present

## 2015-02-04 DIAGNOSIS — Z6821 Body mass index (BMI) 21.0-21.9, adult: Secondary | ICD-10-CM | POA: Diagnosis not present

## 2015-02-04 DIAGNOSIS — I429 Cardiomyopathy, unspecified: Secondary | ICD-10-CM | POA: Diagnosis not present

## 2015-02-04 DIAGNOSIS — F419 Anxiety disorder, unspecified: Secondary | ICD-10-CM | POA: Diagnosis not present

## 2015-02-06 DIAGNOSIS — F0390 Unspecified dementia without behavioral disturbance: Secondary | ICD-10-CM | POA: Diagnosis not present

## 2015-02-06 DIAGNOSIS — E039 Hypothyroidism, unspecified: Secondary | ICD-10-CM | POA: Diagnosis not present

## 2015-02-06 DIAGNOSIS — M810 Age-related osteoporosis without current pathological fracture: Secondary | ICD-10-CM | POA: Diagnosis not present

## 2015-02-06 DIAGNOSIS — Z87891 Personal history of nicotine dependence: Secondary | ICD-10-CM | POA: Diagnosis not present

## 2015-02-06 DIAGNOSIS — Z6821 Body mass index (BMI) 21.0-21.9, adult: Secondary | ICD-10-CM | POA: Diagnosis not present

## 2015-02-06 DIAGNOSIS — M1990 Unspecified osteoarthritis, unspecified site: Secondary | ICD-10-CM | POA: Diagnosis not present

## 2015-02-06 DIAGNOSIS — I872 Venous insufficiency (chronic) (peripheral): Secondary | ICD-10-CM | POA: Diagnosis not present

## 2015-02-06 DIAGNOSIS — F419 Anxiety disorder, unspecified: Secondary | ICD-10-CM | POA: Diagnosis not present

## 2015-02-06 DIAGNOSIS — I10 Essential (primary) hypertension: Secondary | ICD-10-CM | POA: Diagnosis not present

## 2015-02-06 DIAGNOSIS — K219 Gastro-esophageal reflux disease without esophagitis: Secondary | ICD-10-CM | POA: Diagnosis not present

## 2015-02-06 DIAGNOSIS — I429 Cardiomyopathy, unspecified: Secondary | ICD-10-CM | POA: Diagnosis not present

## 2015-02-06 DIAGNOSIS — E785 Hyperlipidemia, unspecified: Secondary | ICD-10-CM | POA: Diagnosis not present

## 2015-02-06 DIAGNOSIS — K589 Irritable bowel syndrome without diarrhea: Secondary | ICD-10-CM | POA: Diagnosis not present

## 2015-02-27 DIAGNOSIS — H353212 Exudative age-related macular degeneration, right eye, with inactive choroidal neovascularization: Secondary | ICD-10-CM | POA: Diagnosis not present

## 2015-03-06 DIAGNOSIS — Z87891 Personal history of nicotine dependence: Secondary | ICD-10-CM | POA: Diagnosis not present

## 2015-03-06 DIAGNOSIS — M810 Age-related osteoporosis without current pathological fracture: Secondary | ICD-10-CM | POA: Diagnosis not present

## 2015-03-06 DIAGNOSIS — I10 Essential (primary) hypertension: Secondary | ICD-10-CM | POA: Diagnosis not present

## 2015-03-06 DIAGNOSIS — E785 Hyperlipidemia, unspecified: Secondary | ICD-10-CM | POA: Diagnosis not present

## 2015-03-06 DIAGNOSIS — Z9981 Dependence on supplemental oxygen: Secondary | ICD-10-CM | POA: Diagnosis not present

## 2015-03-06 DIAGNOSIS — F419 Anxiety disorder, unspecified: Secondary | ICD-10-CM | POA: Diagnosis not present

## 2015-03-06 DIAGNOSIS — M1990 Unspecified osteoarthritis, unspecified site: Secondary | ICD-10-CM | POA: Diagnosis not present

## 2015-03-06 DIAGNOSIS — I872 Venous insufficiency (chronic) (peripheral): Secondary | ICD-10-CM | POA: Diagnosis not present

## 2015-03-06 DIAGNOSIS — I429 Cardiomyopathy, unspecified: Secondary | ICD-10-CM | POA: Diagnosis not present

## 2015-03-06 DIAGNOSIS — E039 Hypothyroidism, unspecified: Secondary | ICD-10-CM | POA: Diagnosis not present

## 2015-03-06 DIAGNOSIS — K589 Irritable bowel syndrome without diarrhea: Secondary | ICD-10-CM | POA: Diagnosis not present

## 2015-03-06 DIAGNOSIS — Z6821 Body mass index (BMI) 21.0-21.9, adult: Secondary | ICD-10-CM | POA: Diagnosis not present

## 2015-03-06 DIAGNOSIS — F0390 Unspecified dementia without behavioral disturbance: Secondary | ICD-10-CM | POA: Diagnosis not present

## 2015-03-06 DIAGNOSIS — K219 Gastro-esophageal reflux disease without esophagitis: Secondary | ICD-10-CM | POA: Diagnosis not present

## 2015-03-12 DIAGNOSIS — I5022 Chronic systolic (congestive) heart failure: Secondary | ICD-10-CM | POA: Diagnosis not present

## 2015-03-12 DIAGNOSIS — I270 Primary pulmonary hypertension: Secondary | ICD-10-CM | POA: Diagnosis not present

## 2015-03-12 DIAGNOSIS — I358 Other nonrheumatic aortic valve disorders: Secondary | ICD-10-CM | POA: Diagnosis not present

## 2015-03-12 DIAGNOSIS — F0150 Vascular dementia without behavioral disturbance: Secondary | ICD-10-CM

## 2015-03-12 DIAGNOSIS — J9611 Chronic respiratory failure with hypoxia: Secondary | ICD-10-CM | POA: Diagnosis not present

## 2015-03-12 DIAGNOSIS — F39 Unspecified mood [affective] disorder: Secondary | ICD-10-CM

## 2015-04-01 DIAGNOSIS — B029 Zoster without complications: Secondary | ICD-10-CM | POA: Diagnosis not present

## 2015-04-04 DIAGNOSIS — Z6821 Body mass index (BMI) 21.0-21.9, adult: Secondary | ICD-10-CM | POA: Diagnosis not present

## 2015-04-04 DIAGNOSIS — F419 Anxiety disorder, unspecified: Secondary | ICD-10-CM | POA: Diagnosis not present

## 2015-04-04 DIAGNOSIS — F0390 Unspecified dementia without behavioral disturbance: Secondary | ICD-10-CM | POA: Diagnosis not present

## 2015-04-04 DIAGNOSIS — I429 Cardiomyopathy, unspecified: Secondary | ICD-10-CM | POA: Diagnosis not present

## 2015-04-06 DIAGNOSIS — I10 Essential (primary) hypertension: Secondary | ICD-10-CM | POA: Diagnosis not present

## 2015-04-06 DIAGNOSIS — I429 Cardiomyopathy, unspecified: Secondary | ICD-10-CM | POA: Diagnosis not present

## 2015-04-06 DIAGNOSIS — M1990 Unspecified osteoarthritis, unspecified site: Secondary | ICD-10-CM | POA: Diagnosis not present

## 2015-04-06 DIAGNOSIS — Z9981 Dependence on supplemental oxygen: Secondary | ICD-10-CM | POA: Diagnosis not present

## 2015-04-06 DIAGNOSIS — K219 Gastro-esophageal reflux disease without esophagitis: Secondary | ICD-10-CM | POA: Diagnosis not present

## 2015-04-06 DIAGNOSIS — K589 Irritable bowel syndrome without diarrhea: Secondary | ICD-10-CM | POA: Diagnosis not present

## 2015-04-06 DIAGNOSIS — Z6821 Body mass index (BMI) 21.0-21.9, adult: Secondary | ICD-10-CM | POA: Diagnosis not present

## 2015-04-06 DIAGNOSIS — Z87891 Personal history of nicotine dependence: Secondary | ICD-10-CM | POA: Diagnosis not present

## 2015-04-06 DIAGNOSIS — E785 Hyperlipidemia, unspecified: Secondary | ICD-10-CM | POA: Diagnosis not present

## 2015-04-06 DIAGNOSIS — I872 Venous insufficiency (chronic) (peripheral): Secondary | ICD-10-CM | POA: Diagnosis not present

## 2015-04-06 DIAGNOSIS — E039 Hypothyroidism, unspecified: Secondary | ICD-10-CM | POA: Diagnosis not present

## 2015-04-06 DIAGNOSIS — F0390 Unspecified dementia without behavioral disturbance: Secondary | ICD-10-CM | POA: Diagnosis not present

## 2015-04-06 DIAGNOSIS — M810 Age-related osteoporosis without current pathological fracture: Secondary | ICD-10-CM | POA: Diagnosis not present

## 2015-04-06 DIAGNOSIS — F419 Anxiety disorder, unspecified: Secondary | ICD-10-CM | POA: Diagnosis not present

## 2015-04-22 DIAGNOSIS — H353212 Exudative age-related macular degeneration, right eye, with inactive choroidal neovascularization: Secondary | ICD-10-CM | POA: Diagnosis not present

## 2015-04-22 DIAGNOSIS — H353124 Nonexudative age-related macular degeneration, left eye, advanced atrophic with subfoveal involvement: Secondary | ICD-10-CM | POA: Diagnosis not present

## 2015-05-02 ENCOUNTER — Ambulatory Visit: Payer: Medicare Other | Admitting: Podiatry

## 2015-05-03 DIAGNOSIS — B351 Tinea unguium: Secondary | ICD-10-CM | POA: Diagnosis not present

## 2015-05-06 DIAGNOSIS — K589 Irritable bowel syndrome without diarrhea: Secondary | ICD-10-CM | POA: Diagnosis not present

## 2015-05-06 DIAGNOSIS — Z9981 Dependence on supplemental oxygen: Secondary | ICD-10-CM | POA: Diagnosis not present

## 2015-05-06 DIAGNOSIS — M1990 Unspecified osteoarthritis, unspecified site: Secondary | ICD-10-CM | POA: Diagnosis not present

## 2015-05-06 DIAGNOSIS — M810 Age-related osteoporosis without current pathological fracture: Secondary | ICD-10-CM | POA: Diagnosis not present

## 2015-05-06 DIAGNOSIS — E039 Hypothyroidism, unspecified: Secondary | ICD-10-CM | POA: Diagnosis not present

## 2015-05-06 DIAGNOSIS — Z87891 Personal history of nicotine dependence: Secondary | ICD-10-CM | POA: Diagnosis not present

## 2015-05-06 DIAGNOSIS — Z6821 Body mass index (BMI) 21.0-21.9, adult: Secondary | ICD-10-CM | POA: Diagnosis not present

## 2015-05-06 DIAGNOSIS — K219 Gastro-esophageal reflux disease without esophagitis: Secondary | ICD-10-CM | POA: Diagnosis not present

## 2015-05-06 DIAGNOSIS — E785 Hyperlipidemia, unspecified: Secondary | ICD-10-CM | POA: Diagnosis not present

## 2015-05-06 DIAGNOSIS — F0390 Unspecified dementia without behavioral disturbance: Secondary | ICD-10-CM | POA: Diagnosis not present

## 2015-05-06 DIAGNOSIS — I429 Cardiomyopathy, unspecified: Secondary | ICD-10-CM | POA: Diagnosis not present

## 2015-05-06 DIAGNOSIS — I10 Essential (primary) hypertension: Secondary | ICD-10-CM | POA: Diagnosis not present

## 2015-05-06 DIAGNOSIS — I872 Venous insufficiency (chronic) (peripheral): Secondary | ICD-10-CM | POA: Diagnosis not present

## 2015-05-06 DIAGNOSIS — F419 Anxiety disorder, unspecified: Secondary | ICD-10-CM | POA: Diagnosis not present

## 2015-05-08 DIAGNOSIS — I5020 Unspecified systolic (congestive) heart failure: Secondary | ICD-10-CM | POA: Diagnosis not present

## 2015-05-08 DIAGNOSIS — F0150 Vascular dementia without behavioral disturbance: Secondary | ICD-10-CM | POA: Diagnosis not present

## 2015-05-08 DIAGNOSIS — F39 Unspecified mood [affective] disorder: Secondary | ICD-10-CM | POA: Diagnosis not present

## 2015-06-04 DIAGNOSIS — Z6821 Body mass index (BMI) 21.0-21.9, adult: Secondary | ICD-10-CM | POA: Diagnosis not present

## 2015-06-04 DIAGNOSIS — F419 Anxiety disorder, unspecified: Secondary | ICD-10-CM | POA: Diagnosis not present

## 2015-06-04 DIAGNOSIS — I429 Cardiomyopathy, unspecified: Secondary | ICD-10-CM | POA: Diagnosis not present

## 2015-06-04 DIAGNOSIS — F0390 Unspecified dementia without behavioral disturbance: Secondary | ICD-10-CM | POA: Diagnosis not present

## 2015-06-06 DIAGNOSIS — F0390 Unspecified dementia without behavioral disturbance: Secondary | ICD-10-CM | POA: Diagnosis not present

## 2015-06-06 DIAGNOSIS — Z9981 Dependence on supplemental oxygen: Secondary | ICD-10-CM | POA: Diagnosis not present

## 2015-06-06 DIAGNOSIS — K589 Irritable bowel syndrome without diarrhea: Secondary | ICD-10-CM | POA: Diagnosis not present

## 2015-06-06 DIAGNOSIS — I872 Venous insufficiency (chronic) (peripheral): Secondary | ICD-10-CM | POA: Diagnosis not present

## 2015-06-06 DIAGNOSIS — I10 Essential (primary) hypertension: Secondary | ICD-10-CM | POA: Diagnosis not present

## 2015-06-06 DIAGNOSIS — Z87891 Personal history of nicotine dependence: Secondary | ICD-10-CM | POA: Diagnosis not present

## 2015-06-06 DIAGNOSIS — E785 Hyperlipidemia, unspecified: Secondary | ICD-10-CM | POA: Diagnosis not present

## 2015-06-06 DIAGNOSIS — I429 Cardiomyopathy, unspecified: Secondary | ICD-10-CM | POA: Diagnosis not present

## 2015-06-06 DIAGNOSIS — F419 Anxiety disorder, unspecified: Secondary | ICD-10-CM | POA: Diagnosis not present

## 2015-06-06 DIAGNOSIS — E039 Hypothyroidism, unspecified: Secondary | ICD-10-CM | POA: Diagnosis not present

## 2015-06-06 DIAGNOSIS — M810 Age-related osteoporosis without current pathological fracture: Secondary | ICD-10-CM | POA: Diagnosis not present

## 2015-06-06 DIAGNOSIS — Z6821 Body mass index (BMI) 21.0-21.9, adult: Secondary | ICD-10-CM | POA: Diagnosis not present

## 2015-06-06 DIAGNOSIS — K219 Gastro-esophageal reflux disease without esophagitis: Secondary | ICD-10-CM | POA: Diagnosis not present

## 2015-06-06 DIAGNOSIS — M1990 Unspecified osteoarthritis, unspecified site: Secondary | ICD-10-CM | POA: Diagnosis not present

## 2015-06-12 DIAGNOSIS — H353212 Exudative age-related macular degeneration, right eye, with inactive choroidal neovascularization: Secondary | ICD-10-CM | POA: Diagnosis not present

## 2015-06-17 DIAGNOSIS — I5023 Acute on chronic systolic (congestive) heart failure: Secondary | ICD-10-CM | POA: Diagnosis not present

## 2015-06-24 DIAGNOSIS — I5023 Acute on chronic systolic (congestive) heart failure: Secondary | ICD-10-CM | POA: Diagnosis not present

## 2015-07-06 DIAGNOSIS — I429 Cardiomyopathy, unspecified: Secondary | ICD-10-CM | POA: Diagnosis not present

## 2015-07-06 DIAGNOSIS — M1990 Unspecified osteoarthritis, unspecified site: Secondary | ICD-10-CM | POA: Diagnosis not present

## 2015-07-06 DIAGNOSIS — Z87891 Personal history of nicotine dependence: Secondary | ICD-10-CM | POA: Diagnosis not present

## 2015-07-06 DIAGNOSIS — F0390 Unspecified dementia without behavioral disturbance: Secondary | ICD-10-CM | POA: Diagnosis not present

## 2015-07-06 DIAGNOSIS — E039 Hypothyroidism, unspecified: Secondary | ICD-10-CM | POA: Diagnosis not present

## 2015-07-06 DIAGNOSIS — Z9981 Dependence on supplemental oxygen: Secondary | ICD-10-CM | POA: Diagnosis not present

## 2015-07-06 DIAGNOSIS — F419 Anxiety disorder, unspecified: Secondary | ICD-10-CM | POA: Diagnosis not present

## 2015-07-06 DIAGNOSIS — K219 Gastro-esophageal reflux disease without esophagitis: Secondary | ICD-10-CM | POA: Diagnosis not present

## 2015-07-06 DIAGNOSIS — I10 Essential (primary) hypertension: Secondary | ICD-10-CM | POA: Diagnosis not present

## 2015-07-06 DIAGNOSIS — I872 Venous insufficiency (chronic) (peripheral): Secondary | ICD-10-CM | POA: Diagnosis not present

## 2015-07-06 DIAGNOSIS — E785 Hyperlipidemia, unspecified: Secondary | ICD-10-CM | POA: Diagnosis not present

## 2015-07-06 DIAGNOSIS — K589 Irritable bowel syndrome without diarrhea: Secondary | ICD-10-CM | POA: Diagnosis not present

## 2015-07-06 DIAGNOSIS — M810 Age-related osteoporosis without current pathological fracture: Secondary | ICD-10-CM | POA: Diagnosis not present

## 2015-07-08 DIAGNOSIS — L03115 Cellulitis of right lower limb: Secondary | ICD-10-CM | POA: Diagnosis not present

## 2015-08-05 DIAGNOSIS — H353212 Exudative age-related macular degeneration, right eye, with inactive choroidal neovascularization: Secondary | ICD-10-CM | POA: Diagnosis not present

## 2015-08-06 DIAGNOSIS — K219 Gastro-esophageal reflux disease without esophagitis: Secondary | ICD-10-CM | POA: Diagnosis not present

## 2015-08-06 DIAGNOSIS — E039 Hypothyroidism, unspecified: Secondary | ICD-10-CM | POA: Diagnosis not present

## 2015-08-06 DIAGNOSIS — F419 Anxiety disorder, unspecified: Secondary | ICD-10-CM | POA: Diagnosis not present

## 2015-08-06 DIAGNOSIS — M1990 Unspecified osteoarthritis, unspecified site: Secondary | ICD-10-CM | POA: Diagnosis not present

## 2015-08-06 DIAGNOSIS — K589 Irritable bowel syndrome without diarrhea: Secondary | ICD-10-CM | POA: Diagnosis not present

## 2015-08-06 DIAGNOSIS — Z9981 Dependence on supplemental oxygen: Secondary | ICD-10-CM | POA: Diagnosis not present

## 2015-08-06 DIAGNOSIS — I10 Essential (primary) hypertension: Secondary | ICD-10-CM | POA: Diagnosis not present

## 2015-08-06 DIAGNOSIS — Z87891 Personal history of nicotine dependence: Secondary | ICD-10-CM | POA: Diagnosis not present

## 2015-08-06 DIAGNOSIS — I872 Venous insufficiency (chronic) (peripheral): Secondary | ICD-10-CM | POA: Diagnosis not present

## 2015-08-06 DIAGNOSIS — I429 Cardiomyopathy, unspecified: Secondary | ICD-10-CM | POA: Diagnosis not present

## 2015-08-06 DIAGNOSIS — E785 Hyperlipidemia, unspecified: Secondary | ICD-10-CM | POA: Diagnosis not present

## 2015-08-06 DIAGNOSIS — M810 Age-related osteoporosis without current pathological fracture: Secondary | ICD-10-CM | POA: Diagnosis not present

## 2015-08-06 DIAGNOSIS — F0390 Unspecified dementia without behavioral disturbance: Secondary | ICD-10-CM | POA: Diagnosis not present

## 2015-08-14 ENCOUNTER — Telehealth: Payer: Self-pay | Admitting: Internal Medicine

## 2015-08-14 NOTE — Telephone Encounter (Signed)
Patient's daughter,Mary,called.  She asked for Dr.Letvak to call her and discuss her mother's condition.

## 2015-08-15 NOTE — Telephone Encounter (Signed)
She just wanted to make sure we weren't doing anything to "keep her going" I explained that we are mostly treating her CHF and anxiety to prevent further symptoms Will confirm that she has a Do Not Hospitalize order

## 2015-08-20 DIAGNOSIS — Z9981 Dependence on supplemental oxygen: Secondary | ICD-10-CM | POA: Diagnosis not present

## 2015-08-20 DIAGNOSIS — F0390 Unspecified dementia without behavioral disturbance: Secondary | ICD-10-CM | POA: Diagnosis not present

## 2015-08-20 DIAGNOSIS — I429 Cardiomyopathy, unspecified: Secondary | ICD-10-CM | POA: Diagnosis not present

## 2015-08-20 DIAGNOSIS — F419 Anxiety disorder, unspecified: Secondary | ICD-10-CM | POA: Diagnosis not present

## 2015-09-06 DIAGNOSIS — I872 Venous insufficiency (chronic) (peripheral): Secondary | ICD-10-CM | POA: Diagnosis not present

## 2015-09-06 DIAGNOSIS — Z9981 Dependence on supplemental oxygen: Secondary | ICD-10-CM | POA: Diagnosis not present

## 2015-09-06 DIAGNOSIS — E039 Hypothyroidism, unspecified: Secondary | ICD-10-CM | POA: Diagnosis not present

## 2015-09-06 DIAGNOSIS — K589 Irritable bowel syndrome without diarrhea: Secondary | ICD-10-CM | POA: Diagnosis not present

## 2015-09-06 DIAGNOSIS — F0390 Unspecified dementia without behavioral disturbance: Secondary | ICD-10-CM | POA: Diagnosis not present

## 2015-09-06 DIAGNOSIS — F419 Anxiety disorder, unspecified: Secondary | ICD-10-CM | POA: Diagnosis not present

## 2015-09-06 DIAGNOSIS — M810 Age-related osteoporosis without current pathological fracture: Secondary | ICD-10-CM | POA: Diagnosis not present

## 2015-09-06 DIAGNOSIS — I429 Cardiomyopathy, unspecified: Secondary | ICD-10-CM | POA: Diagnosis not present

## 2015-09-06 DIAGNOSIS — I10 Essential (primary) hypertension: Secondary | ICD-10-CM | POA: Diagnosis not present

## 2015-09-06 DIAGNOSIS — Z87891 Personal history of nicotine dependence: Secondary | ICD-10-CM | POA: Diagnosis not present

## 2015-09-06 DIAGNOSIS — E785 Hyperlipidemia, unspecified: Secondary | ICD-10-CM | POA: Diagnosis not present

## 2015-09-06 DIAGNOSIS — K219 Gastro-esophageal reflux disease without esophagitis: Secondary | ICD-10-CM | POA: Diagnosis not present

## 2015-09-06 DIAGNOSIS — M1990 Unspecified osteoarthritis, unspecified site: Secondary | ICD-10-CM | POA: Diagnosis not present

## 2015-09-11 DIAGNOSIS — F39 Unspecified mood [affective] disorder: Secondary | ICD-10-CM

## 2015-09-11 DIAGNOSIS — E039 Hypothyroidism, unspecified: Secondary | ICD-10-CM | POA: Diagnosis not present

## 2015-09-11 DIAGNOSIS — K219 Gastro-esophageal reflux disease without esophagitis: Secondary | ICD-10-CM | POA: Diagnosis not present

## 2015-09-11 DIAGNOSIS — F0150 Vascular dementia without behavioral disturbance: Secondary | ICD-10-CM | POA: Diagnosis not present

## 2015-09-11 DIAGNOSIS — I5020 Unspecified systolic (congestive) heart failure: Secondary | ICD-10-CM | POA: Diagnosis not present

## 2015-09-16 DIAGNOSIS — H353212 Exudative age-related macular degeneration, right eye, with inactive choroidal neovascularization: Secondary | ICD-10-CM | POA: Diagnosis not present

## 2015-09-25 DIAGNOSIS — Z9981 Dependence on supplemental oxygen: Secondary | ICD-10-CM | POA: Diagnosis not present

## 2015-09-25 DIAGNOSIS — F0390 Unspecified dementia without behavioral disturbance: Secondary | ICD-10-CM | POA: Diagnosis not present

## 2015-09-25 DIAGNOSIS — F419 Anxiety disorder, unspecified: Secondary | ICD-10-CM | POA: Diagnosis not present

## 2015-09-25 DIAGNOSIS — I429 Cardiomyopathy, unspecified: Secondary | ICD-10-CM | POA: Diagnosis not present

## 2015-10-06 DIAGNOSIS — Z87891 Personal history of nicotine dependence: Secondary | ICD-10-CM | POA: Diagnosis not present

## 2015-10-06 DIAGNOSIS — E039 Hypothyroidism, unspecified: Secondary | ICD-10-CM | POA: Diagnosis not present

## 2015-10-06 DIAGNOSIS — I429 Cardiomyopathy, unspecified: Secondary | ICD-10-CM | POA: Diagnosis not present

## 2015-10-06 DIAGNOSIS — M1990 Unspecified osteoarthritis, unspecified site: Secondary | ICD-10-CM | POA: Diagnosis not present

## 2015-10-06 DIAGNOSIS — F419 Anxiety disorder, unspecified: Secondary | ICD-10-CM | POA: Diagnosis not present

## 2015-10-06 DIAGNOSIS — I10 Essential (primary) hypertension: Secondary | ICD-10-CM | POA: Diagnosis not present

## 2015-10-06 DIAGNOSIS — M810 Age-related osteoporosis without current pathological fracture: Secondary | ICD-10-CM | POA: Diagnosis not present

## 2015-10-06 DIAGNOSIS — F0390 Unspecified dementia without behavioral disturbance: Secondary | ICD-10-CM | POA: Diagnosis not present

## 2015-10-06 DIAGNOSIS — K219 Gastro-esophageal reflux disease without esophagitis: Secondary | ICD-10-CM | POA: Diagnosis not present

## 2015-10-06 DIAGNOSIS — I872 Venous insufficiency (chronic) (peripheral): Secondary | ICD-10-CM | POA: Diagnosis not present

## 2015-10-06 DIAGNOSIS — K589 Irritable bowel syndrome without diarrhea: Secondary | ICD-10-CM | POA: Diagnosis not present

## 2015-10-06 DIAGNOSIS — E785 Hyperlipidemia, unspecified: Secondary | ICD-10-CM | POA: Diagnosis not present

## 2015-10-06 DIAGNOSIS — Z9981 Dependence on supplemental oxygen: Secondary | ICD-10-CM | POA: Diagnosis not present

## 2015-10-14 DIAGNOSIS — R479 Unspecified speech disturbances: Secondary | ICD-10-CM | POA: Diagnosis not present

## 2015-10-14 DIAGNOSIS — R4182 Altered mental status, unspecified: Secondary | ICD-10-CM | POA: Diagnosis not present

## 2015-10-14 DIAGNOSIS — R5383 Other fatigue: Secondary | ICD-10-CM | POA: Diagnosis not present

## 2015-11-06 DIAGNOSIS — E039 Hypothyroidism, unspecified: Secondary | ICD-10-CM | POA: Diagnosis not present

## 2015-11-06 DIAGNOSIS — Z87891 Personal history of nicotine dependence: Secondary | ICD-10-CM | POA: Diagnosis not present

## 2015-11-06 DIAGNOSIS — E785 Hyperlipidemia, unspecified: Secondary | ICD-10-CM | POA: Diagnosis not present

## 2015-11-06 DIAGNOSIS — Z9981 Dependence on supplemental oxygen: Secondary | ICD-10-CM | POA: Diagnosis not present

## 2015-11-06 DIAGNOSIS — K589 Irritable bowel syndrome without diarrhea: Secondary | ICD-10-CM | POA: Diagnosis not present

## 2015-11-06 DIAGNOSIS — M810 Age-related osteoporosis without current pathological fracture: Secondary | ICD-10-CM | POA: Diagnosis not present

## 2015-11-06 DIAGNOSIS — I10 Essential (primary) hypertension: Secondary | ICD-10-CM | POA: Diagnosis not present

## 2015-11-06 DIAGNOSIS — F0390 Unspecified dementia without behavioral disturbance: Secondary | ICD-10-CM | POA: Diagnosis not present

## 2015-11-06 DIAGNOSIS — K219 Gastro-esophageal reflux disease without esophagitis: Secondary | ICD-10-CM | POA: Diagnosis not present

## 2015-11-06 DIAGNOSIS — M1990 Unspecified osteoarthritis, unspecified site: Secondary | ICD-10-CM | POA: Diagnosis not present

## 2015-11-06 DIAGNOSIS — F419 Anxiety disorder, unspecified: Secondary | ICD-10-CM | POA: Diagnosis not present

## 2015-11-06 DIAGNOSIS — I872 Venous insufficiency (chronic) (peripheral): Secondary | ICD-10-CM | POA: Diagnosis not present

## 2015-11-06 DIAGNOSIS — I429 Cardiomyopathy, unspecified: Secondary | ICD-10-CM | POA: Diagnosis not present

## 2015-11-12 DIAGNOSIS — F39 Unspecified mood [affective] disorder: Secondary | ICD-10-CM

## 2015-11-12 DIAGNOSIS — I5022 Chronic systolic (congestive) heart failure: Secondary | ICD-10-CM | POA: Diagnosis not present

## 2015-11-12 DIAGNOSIS — I270 Primary pulmonary hypertension: Secondary | ICD-10-CM | POA: Diagnosis not present

## 2015-11-12 DIAGNOSIS — F0150 Vascular dementia without behavioral disturbance: Secondary | ICD-10-CM | POA: Diagnosis not present

## 2015-11-12 DIAGNOSIS — J9611 Chronic respiratory failure with hypoxia: Secondary | ICD-10-CM | POA: Diagnosis not present

## 2015-11-24 DIAGNOSIS — F419 Anxiety disorder, unspecified: Secondary | ICD-10-CM | POA: Diagnosis not present

## 2015-11-24 DIAGNOSIS — Z9981 Dependence on supplemental oxygen: Secondary | ICD-10-CM | POA: Diagnosis not present

## 2015-11-24 DIAGNOSIS — I429 Cardiomyopathy, unspecified: Secondary | ICD-10-CM | POA: Diagnosis not present

## 2015-11-24 DIAGNOSIS — F0390 Unspecified dementia without behavioral disturbance: Secondary | ICD-10-CM | POA: Diagnosis not present

## 2015-11-26 DIAGNOSIS — H353212 Exudative age-related macular degeneration, right eye, with inactive choroidal neovascularization: Secondary | ICD-10-CM | POA: Diagnosis not present

## 2015-12-06 DIAGNOSIS — Z87891 Personal history of nicotine dependence: Secondary | ICD-10-CM | POA: Diagnosis not present

## 2015-12-06 DIAGNOSIS — I10 Essential (primary) hypertension: Secondary | ICD-10-CM | POA: Diagnosis not present

## 2015-12-06 DIAGNOSIS — F419 Anxiety disorder, unspecified: Secondary | ICD-10-CM | POA: Diagnosis not present

## 2015-12-06 DIAGNOSIS — M1990 Unspecified osteoarthritis, unspecified site: Secondary | ICD-10-CM | POA: Diagnosis not present

## 2015-12-06 DIAGNOSIS — K589 Irritable bowel syndrome without diarrhea: Secondary | ICD-10-CM | POA: Diagnosis not present

## 2015-12-06 DIAGNOSIS — I872 Venous insufficiency (chronic) (peripheral): Secondary | ICD-10-CM | POA: Diagnosis not present

## 2015-12-06 DIAGNOSIS — I429 Cardiomyopathy, unspecified: Secondary | ICD-10-CM | POA: Diagnosis not present

## 2015-12-06 DIAGNOSIS — K219 Gastro-esophageal reflux disease without esophagitis: Secondary | ICD-10-CM | POA: Diagnosis not present

## 2015-12-06 DIAGNOSIS — M810 Age-related osteoporosis without current pathological fracture: Secondary | ICD-10-CM | POA: Diagnosis not present

## 2015-12-06 DIAGNOSIS — F0390 Unspecified dementia without behavioral disturbance: Secondary | ICD-10-CM | POA: Diagnosis not present

## 2015-12-06 DIAGNOSIS — E039 Hypothyroidism, unspecified: Secondary | ICD-10-CM | POA: Diagnosis not present

## 2015-12-06 DIAGNOSIS — Z9981 Dependence on supplemental oxygen: Secondary | ICD-10-CM | POA: Diagnosis not present

## 2015-12-06 DIAGNOSIS — E785 Hyperlipidemia, unspecified: Secondary | ICD-10-CM | POA: Diagnosis not present

## 2016-01-06 DIAGNOSIS — Z87891 Personal history of nicotine dependence: Secondary | ICD-10-CM | POA: Diagnosis not present

## 2016-01-06 DIAGNOSIS — M1990 Unspecified osteoarthritis, unspecified site: Secondary | ICD-10-CM | POA: Diagnosis not present

## 2016-01-06 DIAGNOSIS — F419 Anxiety disorder, unspecified: Secondary | ICD-10-CM | POA: Diagnosis not present

## 2016-01-06 DIAGNOSIS — Z9981 Dependence on supplemental oxygen: Secondary | ICD-10-CM | POA: Diagnosis not present

## 2016-01-06 DIAGNOSIS — E785 Hyperlipidemia, unspecified: Secondary | ICD-10-CM | POA: Diagnosis not present

## 2016-01-06 DIAGNOSIS — E039 Hypothyroidism, unspecified: Secondary | ICD-10-CM | POA: Diagnosis not present

## 2016-01-06 DIAGNOSIS — I872 Venous insufficiency (chronic) (peripheral): Secondary | ICD-10-CM | POA: Diagnosis not present

## 2016-01-06 DIAGNOSIS — K589 Irritable bowel syndrome without diarrhea: Secondary | ICD-10-CM | POA: Diagnosis not present

## 2016-01-06 DIAGNOSIS — K219 Gastro-esophageal reflux disease without esophagitis: Secondary | ICD-10-CM | POA: Diagnosis not present

## 2016-01-06 DIAGNOSIS — F0390 Unspecified dementia without behavioral disturbance: Secondary | ICD-10-CM | POA: Diagnosis not present

## 2016-01-06 DIAGNOSIS — M810 Age-related osteoporosis without current pathological fracture: Secondary | ICD-10-CM | POA: Diagnosis not present

## 2016-01-06 DIAGNOSIS — I10 Essential (primary) hypertension: Secondary | ICD-10-CM | POA: Diagnosis not present

## 2016-01-06 DIAGNOSIS — I429 Cardiomyopathy, unspecified: Secondary | ICD-10-CM | POA: Diagnosis not present

## 2016-01-07 DIAGNOSIS — I10 Essential (primary) hypertension: Secondary | ICD-10-CM | POA: Diagnosis not present

## 2016-01-07 DIAGNOSIS — F0390 Unspecified dementia without behavioral disturbance: Secondary | ICD-10-CM | POA: Diagnosis not present

## 2016-01-07 DIAGNOSIS — M1990 Unspecified osteoarthritis, unspecified site: Secondary | ICD-10-CM | POA: Diagnosis not present

## 2016-01-07 DIAGNOSIS — I429 Cardiomyopathy, unspecified: Secondary | ICD-10-CM | POA: Diagnosis not present

## 2016-01-07 DIAGNOSIS — F419 Anxiety disorder, unspecified: Secondary | ICD-10-CM | POA: Diagnosis not present

## 2016-01-07 DIAGNOSIS — Z9981 Dependence on supplemental oxygen: Secondary | ICD-10-CM | POA: Diagnosis not present

## 2016-01-08 DIAGNOSIS — Z9981 Dependence on supplemental oxygen: Secondary | ICD-10-CM | POA: Diagnosis not present

## 2016-01-08 DIAGNOSIS — F419 Anxiety disorder, unspecified: Secondary | ICD-10-CM | POA: Diagnosis not present

## 2016-01-08 DIAGNOSIS — M1990 Unspecified osteoarthritis, unspecified site: Secondary | ICD-10-CM | POA: Diagnosis not present

## 2016-01-08 DIAGNOSIS — F0390 Unspecified dementia without behavioral disturbance: Secondary | ICD-10-CM | POA: Diagnosis not present

## 2016-01-08 DIAGNOSIS — I429 Cardiomyopathy, unspecified: Secondary | ICD-10-CM | POA: Diagnosis not present

## 2016-01-08 DIAGNOSIS — I10 Essential (primary) hypertension: Secondary | ICD-10-CM | POA: Diagnosis not present

## 2016-01-09 DIAGNOSIS — F419 Anxiety disorder, unspecified: Secondary | ICD-10-CM | POA: Diagnosis not present

## 2016-01-09 DIAGNOSIS — F0390 Unspecified dementia without behavioral disturbance: Secondary | ICD-10-CM | POA: Diagnosis not present

## 2016-01-09 DIAGNOSIS — Z9981 Dependence on supplemental oxygen: Secondary | ICD-10-CM | POA: Diagnosis not present

## 2016-01-09 DIAGNOSIS — I429 Cardiomyopathy, unspecified: Secondary | ICD-10-CM | POA: Diagnosis not present

## 2016-01-09 DIAGNOSIS — I10 Essential (primary) hypertension: Secondary | ICD-10-CM | POA: Diagnosis not present

## 2016-01-09 DIAGNOSIS — M1990 Unspecified osteoarthritis, unspecified site: Secondary | ICD-10-CM | POA: Diagnosis not present

## 2016-01-10 DIAGNOSIS — J9611 Chronic respiratory failure with hypoxia: Secondary | ICD-10-CM | POA: Diagnosis not present

## 2016-01-10 DIAGNOSIS — I350 Nonrheumatic aortic (valve) stenosis: Secondary | ICD-10-CM | POA: Diagnosis not present

## 2016-01-10 DIAGNOSIS — I10 Essential (primary) hypertension: Secondary | ICD-10-CM | POA: Diagnosis not present

## 2016-01-10 DIAGNOSIS — K219 Gastro-esophageal reflux disease without esophagitis: Secondary | ICD-10-CM | POA: Diagnosis not present

## 2016-01-10 DIAGNOSIS — I429 Cardiomyopathy, unspecified: Secondary | ICD-10-CM | POA: Diagnosis not present

## 2016-01-10 DIAGNOSIS — I270 Primary pulmonary hypertension: Secondary | ICD-10-CM | POA: Diagnosis not present

## 2016-01-10 DIAGNOSIS — F0390 Unspecified dementia without behavioral disturbance: Secondary | ICD-10-CM | POA: Diagnosis not present

## 2016-01-10 DIAGNOSIS — F419 Anxiety disorder, unspecified: Secondary | ICD-10-CM | POA: Diagnosis not present

## 2016-01-10 DIAGNOSIS — E039 Hypothyroidism, unspecified: Secondary | ICD-10-CM | POA: Diagnosis not present

## 2016-01-10 DIAGNOSIS — M1990 Unspecified osteoarthritis, unspecified site: Secondary | ICD-10-CM | POA: Diagnosis not present

## 2016-01-10 DIAGNOSIS — I5020 Unspecified systolic (congestive) heart failure: Secondary | ICD-10-CM | POA: Diagnosis not present

## 2016-01-10 DIAGNOSIS — Z9981 Dependence on supplemental oxygen: Secondary | ICD-10-CM | POA: Diagnosis not present

## 2016-01-10 DIAGNOSIS — F0150 Vascular dementia without behavioral disturbance: Secondary | ICD-10-CM | POA: Diagnosis not present

## 2016-01-13 DIAGNOSIS — F419 Anxiety disorder, unspecified: Secondary | ICD-10-CM | POA: Diagnosis not present

## 2016-01-13 DIAGNOSIS — M1990 Unspecified osteoarthritis, unspecified site: Secondary | ICD-10-CM | POA: Diagnosis not present

## 2016-01-13 DIAGNOSIS — F0390 Unspecified dementia without behavioral disturbance: Secondary | ICD-10-CM | POA: Diagnosis not present

## 2016-01-13 DIAGNOSIS — Z9981 Dependence on supplemental oxygen: Secondary | ICD-10-CM | POA: Diagnosis not present

## 2016-01-13 DIAGNOSIS — I429 Cardiomyopathy, unspecified: Secondary | ICD-10-CM | POA: Diagnosis not present

## 2016-01-13 DIAGNOSIS — I10 Essential (primary) hypertension: Secondary | ICD-10-CM | POA: Diagnosis not present

## 2016-01-14 DIAGNOSIS — M1990 Unspecified osteoarthritis, unspecified site: Secondary | ICD-10-CM | POA: Diagnosis not present

## 2016-01-14 DIAGNOSIS — I10 Essential (primary) hypertension: Secondary | ICD-10-CM | POA: Diagnosis not present

## 2016-01-14 DIAGNOSIS — Z9981 Dependence on supplemental oxygen: Secondary | ICD-10-CM | POA: Diagnosis not present

## 2016-01-14 DIAGNOSIS — F419 Anxiety disorder, unspecified: Secondary | ICD-10-CM | POA: Diagnosis not present

## 2016-01-14 DIAGNOSIS — I429 Cardiomyopathy, unspecified: Secondary | ICD-10-CM | POA: Diagnosis not present

## 2016-01-14 DIAGNOSIS — F0390 Unspecified dementia without behavioral disturbance: Secondary | ICD-10-CM | POA: Diagnosis not present

## 2016-01-15 DIAGNOSIS — Z9981 Dependence on supplemental oxygen: Secondary | ICD-10-CM | POA: Diagnosis not present

## 2016-01-15 DIAGNOSIS — I429 Cardiomyopathy, unspecified: Secondary | ICD-10-CM | POA: Diagnosis not present

## 2016-01-15 DIAGNOSIS — I10 Essential (primary) hypertension: Secondary | ICD-10-CM | POA: Diagnosis not present

## 2016-01-15 DIAGNOSIS — M1990 Unspecified osteoarthritis, unspecified site: Secondary | ICD-10-CM | POA: Diagnosis not present

## 2016-01-15 DIAGNOSIS — F0390 Unspecified dementia without behavioral disturbance: Secondary | ICD-10-CM | POA: Diagnosis not present

## 2016-01-15 DIAGNOSIS — F419 Anxiety disorder, unspecified: Secondary | ICD-10-CM | POA: Diagnosis not present

## 2016-01-16 DIAGNOSIS — M1990 Unspecified osteoarthritis, unspecified site: Secondary | ICD-10-CM | POA: Diagnosis not present

## 2016-01-16 DIAGNOSIS — F0390 Unspecified dementia without behavioral disturbance: Secondary | ICD-10-CM | POA: Diagnosis not present

## 2016-01-16 DIAGNOSIS — I429 Cardiomyopathy, unspecified: Secondary | ICD-10-CM | POA: Diagnosis not present

## 2016-01-16 DIAGNOSIS — F419 Anxiety disorder, unspecified: Secondary | ICD-10-CM | POA: Diagnosis not present

## 2016-01-16 DIAGNOSIS — Z9981 Dependence on supplemental oxygen: Secondary | ICD-10-CM | POA: Diagnosis not present

## 2016-01-16 DIAGNOSIS — I10 Essential (primary) hypertension: Secondary | ICD-10-CM | POA: Diagnosis not present

## 2016-01-17 DIAGNOSIS — F419 Anxiety disorder, unspecified: Secondary | ICD-10-CM | POA: Diagnosis not present

## 2016-01-17 DIAGNOSIS — I10 Essential (primary) hypertension: Secondary | ICD-10-CM | POA: Diagnosis not present

## 2016-01-17 DIAGNOSIS — F0390 Unspecified dementia without behavioral disturbance: Secondary | ICD-10-CM | POA: Diagnosis not present

## 2016-01-17 DIAGNOSIS — Z9981 Dependence on supplemental oxygen: Secondary | ICD-10-CM | POA: Diagnosis not present

## 2016-01-17 DIAGNOSIS — M1990 Unspecified osteoarthritis, unspecified site: Secondary | ICD-10-CM | POA: Diagnosis not present

## 2016-01-17 DIAGNOSIS — I429 Cardiomyopathy, unspecified: Secondary | ICD-10-CM | POA: Diagnosis not present

## 2016-01-20 DIAGNOSIS — F0390 Unspecified dementia without behavioral disturbance: Secondary | ICD-10-CM | POA: Diagnosis not present

## 2016-01-20 DIAGNOSIS — Z9981 Dependence on supplemental oxygen: Secondary | ICD-10-CM | POA: Diagnosis not present

## 2016-01-20 DIAGNOSIS — I429 Cardiomyopathy, unspecified: Secondary | ICD-10-CM | POA: Diagnosis not present

## 2016-01-20 DIAGNOSIS — M1990 Unspecified osteoarthritis, unspecified site: Secondary | ICD-10-CM | POA: Diagnosis not present

## 2016-01-20 DIAGNOSIS — F419 Anxiety disorder, unspecified: Secondary | ICD-10-CM | POA: Diagnosis not present

## 2016-01-20 DIAGNOSIS — I10 Essential (primary) hypertension: Secondary | ICD-10-CM | POA: Diagnosis not present

## 2016-01-21 DIAGNOSIS — M1990 Unspecified osteoarthritis, unspecified site: Secondary | ICD-10-CM | POA: Diagnosis not present

## 2016-01-21 DIAGNOSIS — F0390 Unspecified dementia without behavioral disturbance: Secondary | ICD-10-CM | POA: Diagnosis not present

## 2016-01-21 DIAGNOSIS — F419 Anxiety disorder, unspecified: Secondary | ICD-10-CM | POA: Diagnosis not present

## 2016-01-21 DIAGNOSIS — I429 Cardiomyopathy, unspecified: Secondary | ICD-10-CM | POA: Diagnosis not present

## 2016-01-21 DIAGNOSIS — Z9981 Dependence on supplemental oxygen: Secondary | ICD-10-CM | POA: Diagnosis not present

## 2016-01-21 DIAGNOSIS — I10 Essential (primary) hypertension: Secondary | ICD-10-CM | POA: Diagnosis not present

## 2016-01-22 DIAGNOSIS — M1990 Unspecified osteoarthritis, unspecified site: Secondary | ICD-10-CM | POA: Diagnosis not present

## 2016-01-22 DIAGNOSIS — F419 Anxiety disorder, unspecified: Secondary | ICD-10-CM | POA: Diagnosis not present

## 2016-01-22 DIAGNOSIS — I429 Cardiomyopathy, unspecified: Secondary | ICD-10-CM | POA: Diagnosis not present

## 2016-01-22 DIAGNOSIS — Z9981 Dependence on supplemental oxygen: Secondary | ICD-10-CM | POA: Diagnosis not present

## 2016-01-22 DIAGNOSIS — I10 Essential (primary) hypertension: Secondary | ICD-10-CM | POA: Diagnosis not present

## 2016-01-22 DIAGNOSIS — F0390 Unspecified dementia without behavioral disturbance: Secondary | ICD-10-CM | POA: Diagnosis not present

## 2016-01-24 DIAGNOSIS — F0390 Unspecified dementia without behavioral disturbance: Secondary | ICD-10-CM | POA: Diagnosis not present

## 2016-01-24 DIAGNOSIS — M1990 Unspecified osteoarthritis, unspecified site: Secondary | ICD-10-CM | POA: Diagnosis not present

## 2016-01-24 DIAGNOSIS — Z9981 Dependence on supplemental oxygen: Secondary | ICD-10-CM | POA: Diagnosis not present

## 2016-01-24 DIAGNOSIS — I429 Cardiomyopathy, unspecified: Secondary | ICD-10-CM | POA: Diagnosis not present

## 2016-01-24 DIAGNOSIS — F419 Anxiety disorder, unspecified: Secondary | ICD-10-CM | POA: Diagnosis not present

## 2016-01-24 DIAGNOSIS — I10 Essential (primary) hypertension: Secondary | ICD-10-CM | POA: Diagnosis not present

## 2016-01-27 DIAGNOSIS — F419 Anxiety disorder, unspecified: Secondary | ICD-10-CM | POA: Diagnosis not present

## 2016-01-27 DIAGNOSIS — Z9981 Dependence on supplemental oxygen: Secondary | ICD-10-CM | POA: Diagnosis not present

## 2016-01-27 DIAGNOSIS — M1990 Unspecified osteoarthritis, unspecified site: Secondary | ICD-10-CM | POA: Diagnosis not present

## 2016-01-27 DIAGNOSIS — I10 Essential (primary) hypertension: Secondary | ICD-10-CM | POA: Diagnosis not present

## 2016-01-27 DIAGNOSIS — I429 Cardiomyopathy, unspecified: Secondary | ICD-10-CM | POA: Diagnosis not present

## 2016-01-27 DIAGNOSIS — F0390 Unspecified dementia without behavioral disturbance: Secondary | ICD-10-CM | POA: Diagnosis not present

## 2016-01-28 DIAGNOSIS — F419 Anxiety disorder, unspecified: Secondary | ICD-10-CM | POA: Diagnosis not present

## 2016-01-28 DIAGNOSIS — F0390 Unspecified dementia without behavioral disturbance: Secondary | ICD-10-CM | POA: Diagnosis not present

## 2016-01-28 DIAGNOSIS — Z9981 Dependence on supplemental oxygen: Secondary | ICD-10-CM | POA: Diagnosis not present

## 2016-01-28 DIAGNOSIS — I10 Essential (primary) hypertension: Secondary | ICD-10-CM | POA: Diagnosis not present

## 2016-01-28 DIAGNOSIS — I429 Cardiomyopathy, unspecified: Secondary | ICD-10-CM | POA: Diagnosis not present

## 2016-01-28 DIAGNOSIS — M1990 Unspecified osteoarthritis, unspecified site: Secondary | ICD-10-CM | POA: Diagnosis not present

## 2016-01-29 DIAGNOSIS — F0390 Unspecified dementia without behavioral disturbance: Secondary | ICD-10-CM | POA: Diagnosis not present

## 2016-01-29 DIAGNOSIS — I10 Essential (primary) hypertension: Secondary | ICD-10-CM | POA: Diagnosis not present

## 2016-01-29 DIAGNOSIS — M1990 Unspecified osteoarthritis, unspecified site: Secondary | ICD-10-CM | POA: Diagnosis not present

## 2016-01-29 DIAGNOSIS — F419 Anxiety disorder, unspecified: Secondary | ICD-10-CM | POA: Diagnosis not present

## 2016-01-29 DIAGNOSIS — Z9981 Dependence on supplemental oxygen: Secondary | ICD-10-CM | POA: Diagnosis not present

## 2016-01-29 DIAGNOSIS — I429 Cardiomyopathy, unspecified: Secondary | ICD-10-CM | POA: Diagnosis not present

## 2016-01-30 DIAGNOSIS — F419 Anxiety disorder, unspecified: Secondary | ICD-10-CM | POA: Diagnosis not present

## 2016-01-30 DIAGNOSIS — Z9981 Dependence on supplemental oxygen: Secondary | ICD-10-CM | POA: Diagnosis not present

## 2016-01-30 DIAGNOSIS — F0390 Unspecified dementia without behavioral disturbance: Secondary | ICD-10-CM | POA: Diagnosis not present

## 2016-01-30 DIAGNOSIS — I429 Cardiomyopathy, unspecified: Secondary | ICD-10-CM | POA: Diagnosis not present

## 2016-01-30 DIAGNOSIS — I10 Essential (primary) hypertension: Secondary | ICD-10-CM | POA: Diagnosis not present

## 2016-01-30 DIAGNOSIS — M1990 Unspecified osteoarthritis, unspecified site: Secondary | ICD-10-CM | POA: Diagnosis not present

## 2016-01-31 DIAGNOSIS — H353212 Exudative age-related macular degeneration, right eye, with inactive choroidal neovascularization: Secondary | ICD-10-CM | POA: Diagnosis not present

## 2016-01-31 DIAGNOSIS — Z9981 Dependence on supplemental oxygen: Secondary | ICD-10-CM | POA: Diagnosis not present

## 2016-01-31 DIAGNOSIS — F419 Anxiety disorder, unspecified: Secondary | ICD-10-CM | POA: Diagnosis not present

## 2016-01-31 DIAGNOSIS — F0390 Unspecified dementia without behavioral disturbance: Secondary | ICD-10-CM | POA: Diagnosis not present

## 2016-01-31 DIAGNOSIS — I429 Cardiomyopathy, unspecified: Secondary | ICD-10-CM | POA: Diagnosis not present

## 2016-01-31 DIAGNOSIS — I10 Essential (primary) hypertension: Secondary | ICD-10-CM | POA: Diagnosis not present

## 2016-01-31 DIAGNOSIS — M1990 Unspecified osteoarthritis, unspecified site: Secondary | ICD-10-CM | POA: Diagnosis not present

## 2016-02-03 DIAGNOSIS — F419 Anxiety disorder, unspecified: Secondary | ICD-10-CM | POA: Diagnosis not present

## 2016-02-03 DIAGNOSIS — Z9981 Dependence on supplemental oxygen: Secondary | ICD-10-CM | POA: Diagnosis not present

## 2016-02-03 DIAGNOSIS — I10 Essential (primary) hypertension: Secondary | ICD-10-CM | POA: Diagnosis not present

## 2016-02-03 DIAGNOSIS — I429 Cardiomyopathy, unspecified: Secondary | ICD-10-CM | POA: Diagnosis not present

## 2016-02-03 DIAGNOSIS — M1990 Unspecified osteoarthritis, unspecified site: Secondary | ICD-10-CM | POA: Diagnosis not present

## 2016-02-03 DIAGNOSIS — F0390 Unspecified dementia without behavioral disturbance: Secondary | ICD-10-CM | POA: Diagnosis not present

## 2016-02-04 DIAGNOSIS — M1990 Unspecified osteoarthritis, unspecified site: Secondary | ICD-10-CM | POA: Diagnosis not present

## 2016-02-04 DIAGNOSIS — F419 Anxiety disorder, unspecified: Secondary | ICD-10-CM | POA: Diagnosis not present

## 2016-02-04 DIAGNOSIS — Z9981 Dependence on supplemental oxygen: Secondary | ICD-10-CM | POA: Diagnosis not present

## 2016-02-04 DIAGNOSIS — I429 Cardiomyopathy, unspecified: Secondary | ICD-10-CM | POA: Diagnosis not present

## 2016-02-04 DIAGNOSIS — F0390 Unspecified dementia without behavioral disturbance: Secondary | ICD-10-CM | POA: Diagnosis not present

## 2016-02-04 DIAGNOSIS — I10 Essential (primary) hypertension: Secondary | ICD-10-CM | POA: Diagnosis not present

## 2016-02-05 DIAGNOSIS — F419 Anxiety disorder, unspecified: Secondary | ICD-10-CM | POA: Diagnosis not present

## 2016-02-05 DIAGNOSIS — I429 Cardiomyopathy, unspecified: Secondary | ICD-10-CM | POA: Diagnosis not present

## 2016-02-05 DIAGNOSIS — F0390 Unspecified dementia without behavioral disturbance: Secondary | ICD-10-CM | POA: Diagnosis not present

## 2016-02-05 DIAGNOSIS — M1990 Unspecified osteoarthritis, unspecified site: Secondary | ICD-10-CM | POA: Diagnosis not present

## 2016-02-05 DIAGNOSIS — Z9981 Dependence on supplemental oxygen: Secondary | ICD-10-CM | POA: Diagnosis not present

## 2016-02-05 DIAGNOSIS — I10 Essential (primary) hypertension: Secondary | ICD-10-CM | POA: Diagnosis not present

## 2016-02-06 DIAGNOSIS — E039 Hypothyroidism, unspecified: Secondary | ICD-10-CM | POA: Diagnosis not present

## 2016-02-06 DIAGNOSIS — F419 Anxiety disorder, unspecified: Secondary | ICD-10-CM | POA: Diagnosis not present

## 2016-02-06 DIAGNOSIS — Z9981 Dependence on supplemental oxygen: Secondary | ICD-10-CM | POA: Diagnosis not present

## 2016-02-06 DIAGNOSIS — K219 Gastro-esophageal reflux disease without esophagitis: Secondary | ICD-10-CM | POA: Diagnosis not present

## 2016-02-06 DIAGNOSIS — K589 Irritable bowel syndrome without diarrhea: Secondary | ICD-10-CM | POA: Diagnosis not present

## 2016-02-06 DIAGNOSIS — Z87891 Personal history of nicotine dependence: Secondary | ICD-10-CM | POA: Diagnosis not present

## 2016-02-06 DIAGNOSIS — F0390 Unspecified dementia without behavioral disturbance: Secondary | ICD-10-CM | POA: Diagnosis not present

## 2016-02-06 DIAGNOSIS — I10 Essential (primary) hypertension: Secondary | ICD-10-CM | POA: Diagnosis not present

## 2016-02-06 DIAGNOSIS — M1990 Unspecified osteoarthritis, unspecified site: Secondary | ICD-10-CM | POA: Diagnosis not present

## 2016-02-06 DIAGNOSIS — I429 Cardiomyopathy, unspecified: Secondary | ICD-10-CM | POA: Diagnosis not present

## 2016-02-06 DIAGNOSIS — I872 Venous insufficiency (chronic) (peripheral): Secondary | ICD-10-CM | POA: Diagnosis not present

## 2016-02-06 DIAGNOSIS — E785 Hyperlipidemia, unspecified: Secondary | ICD-10-CM | POA: Diagnosis not present

## 2016-02-06 DIAGNOSIS — M810 Age-related osteoporosis without current pathological fracture: Secondary | ICD-10-CM | POA: Diagnosis not present

## 2016-02-07 DIAGNOSIS — I429 Cardiomyopathy, unspecified: Secondary | ICD-10-CM | POA: Diagnosis not present

## 2016-02-07 DIAGNOSIS — I10 Essential (primary) hypertension: Secondary | ICD-10-CM | POA: Diagnosis not present

## 2016-02-07 DIAGNOSIS — F0390 Unspecified dementia without behavioral disturbance: Secondary | ICD-10-CM | POA: Diagnosis not present

## 2016-02-07 DIAGNOSIS — M1990 Unspecified osteoarthritis, unspecified site: Secondary | ICD-10-CM | POA: Diagnosis not present

## 2016-02-07 DIAGNOSIS — F419 Anxiety disorder, unspecified: Secondary | ICD-10-CM | POA: Diagnosis not present

## 2016-02-07 DIAGNOSIS — Z9981 Dependence on supplemental oxygen: Secondary | ICD-10-CM | POA: Diagnosis not present

## 2016-02-10 DIAGNOSIS — I429 Cardiomyopathy, unspecified: Secondary | ICD-10-CM | POA: Diagnosis not present

## 2016-02-10 DIAGNOSIS — I10 Essential (primary) hypertension: Secondary | ICD-10-CM | POA: Diagnosis not present

## 2016-02-10 DIAGNOSIS — Z9981 Dependence on supplemental oxygen: Secondary | ICD-10-CM | POA: Diagnosis not present

## 2016-02-10 DIAGNOSIS — F419 Anxiety disorder, unspecified: Secondary | ICD-10-CM | POA: Diagnosis not present

## 2016-02-10 DIAGNOSIS — M1990 Unspecified osteoarthritis, unspecified site: Secondary | ICD-10-CM | POA: Diagnosis not present

## 2016-02-10 DIAGNOSIS — F0390 Unspecified dementia without behavioral disturbance: Secondary | ICD-10-CM | POA: Diagnosis not present

## 2016-02-10 IMAGING — CR DG CHEST 2V
1 series · 2 of 2 positions shown · non-contrast
Comparison: Radiograph 05/08/2009

CLINICAL DATA: Chest pain, short of breath, difficulty swallowing

EXAM:
CHEST  2 VIEW

[Series 1: dxr chest pa (or ap) and lateral · 0.14mm/px · 2 of 2 slices shown]
[im 1/2]
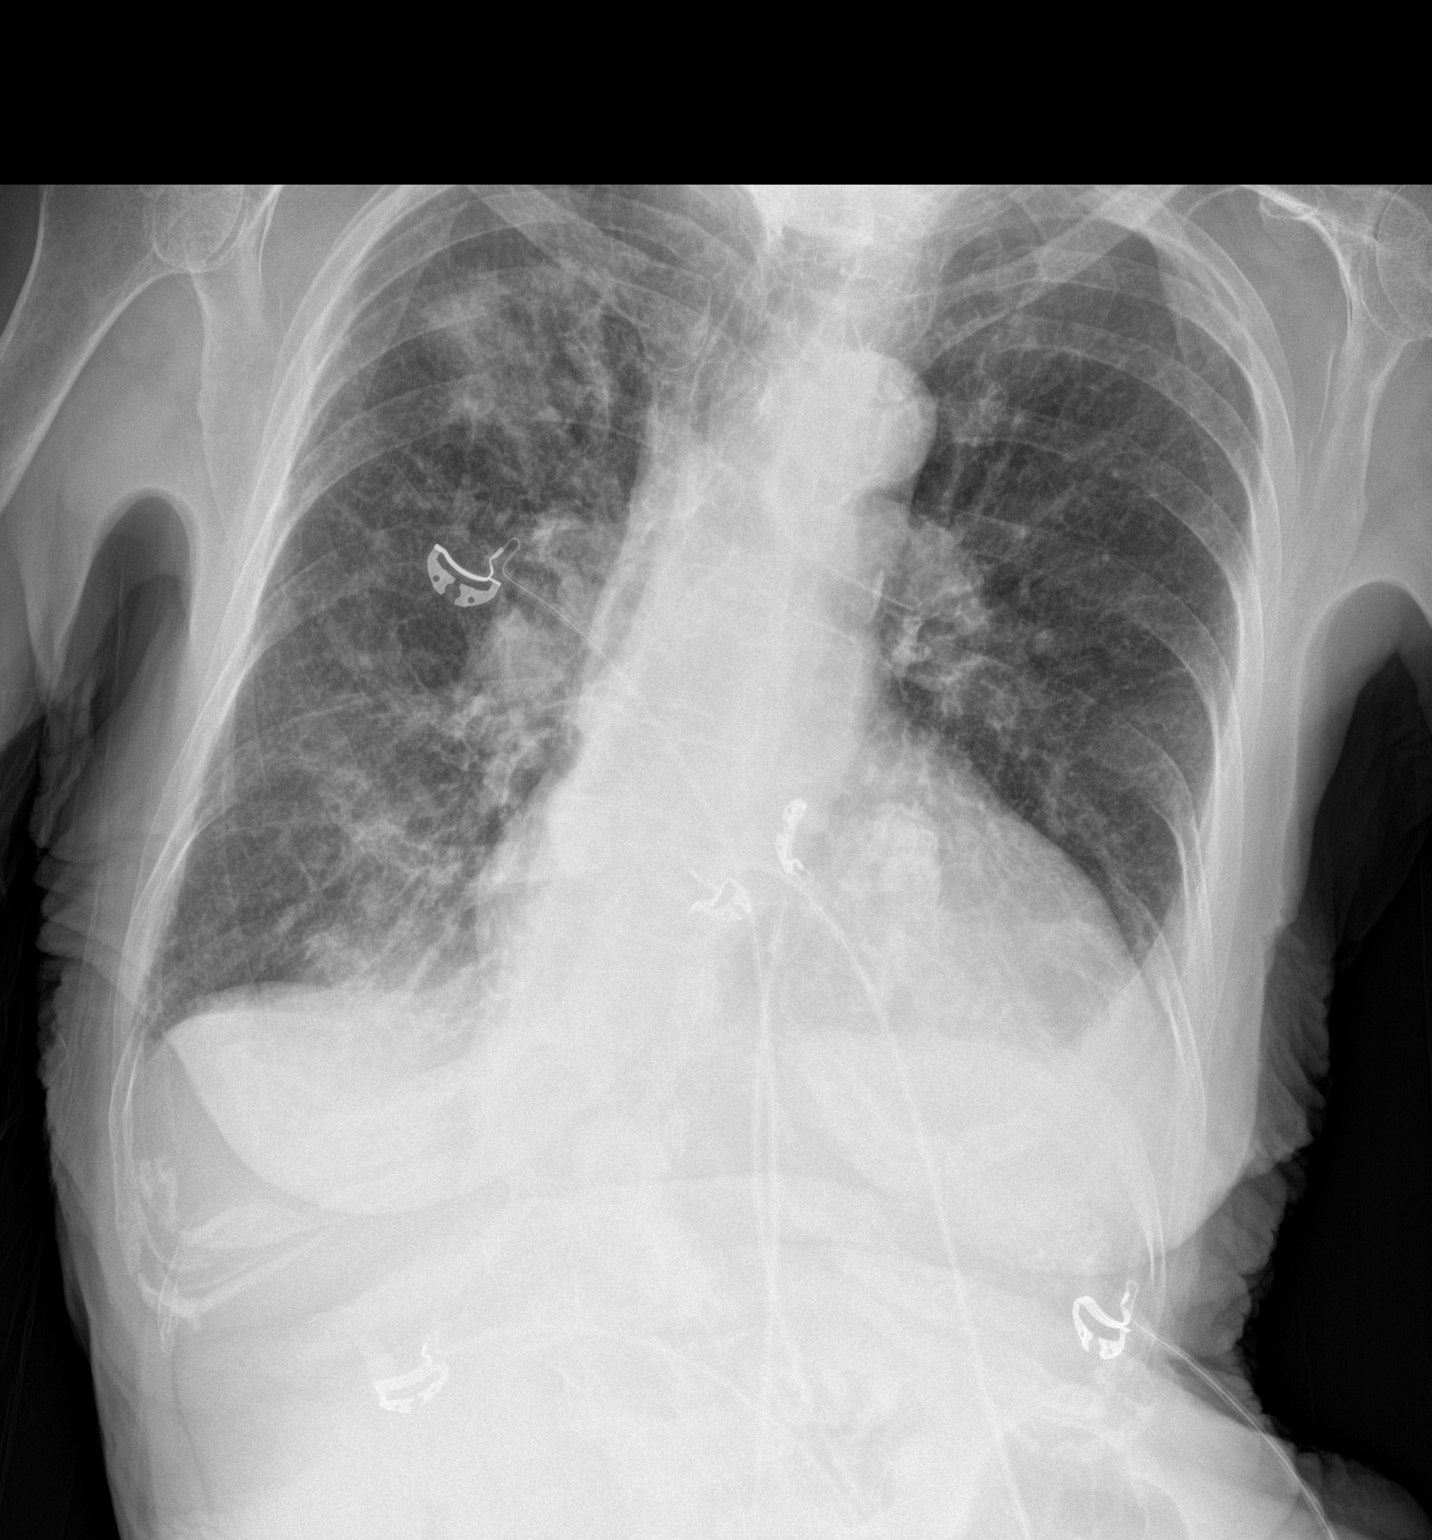
[im 2/2]
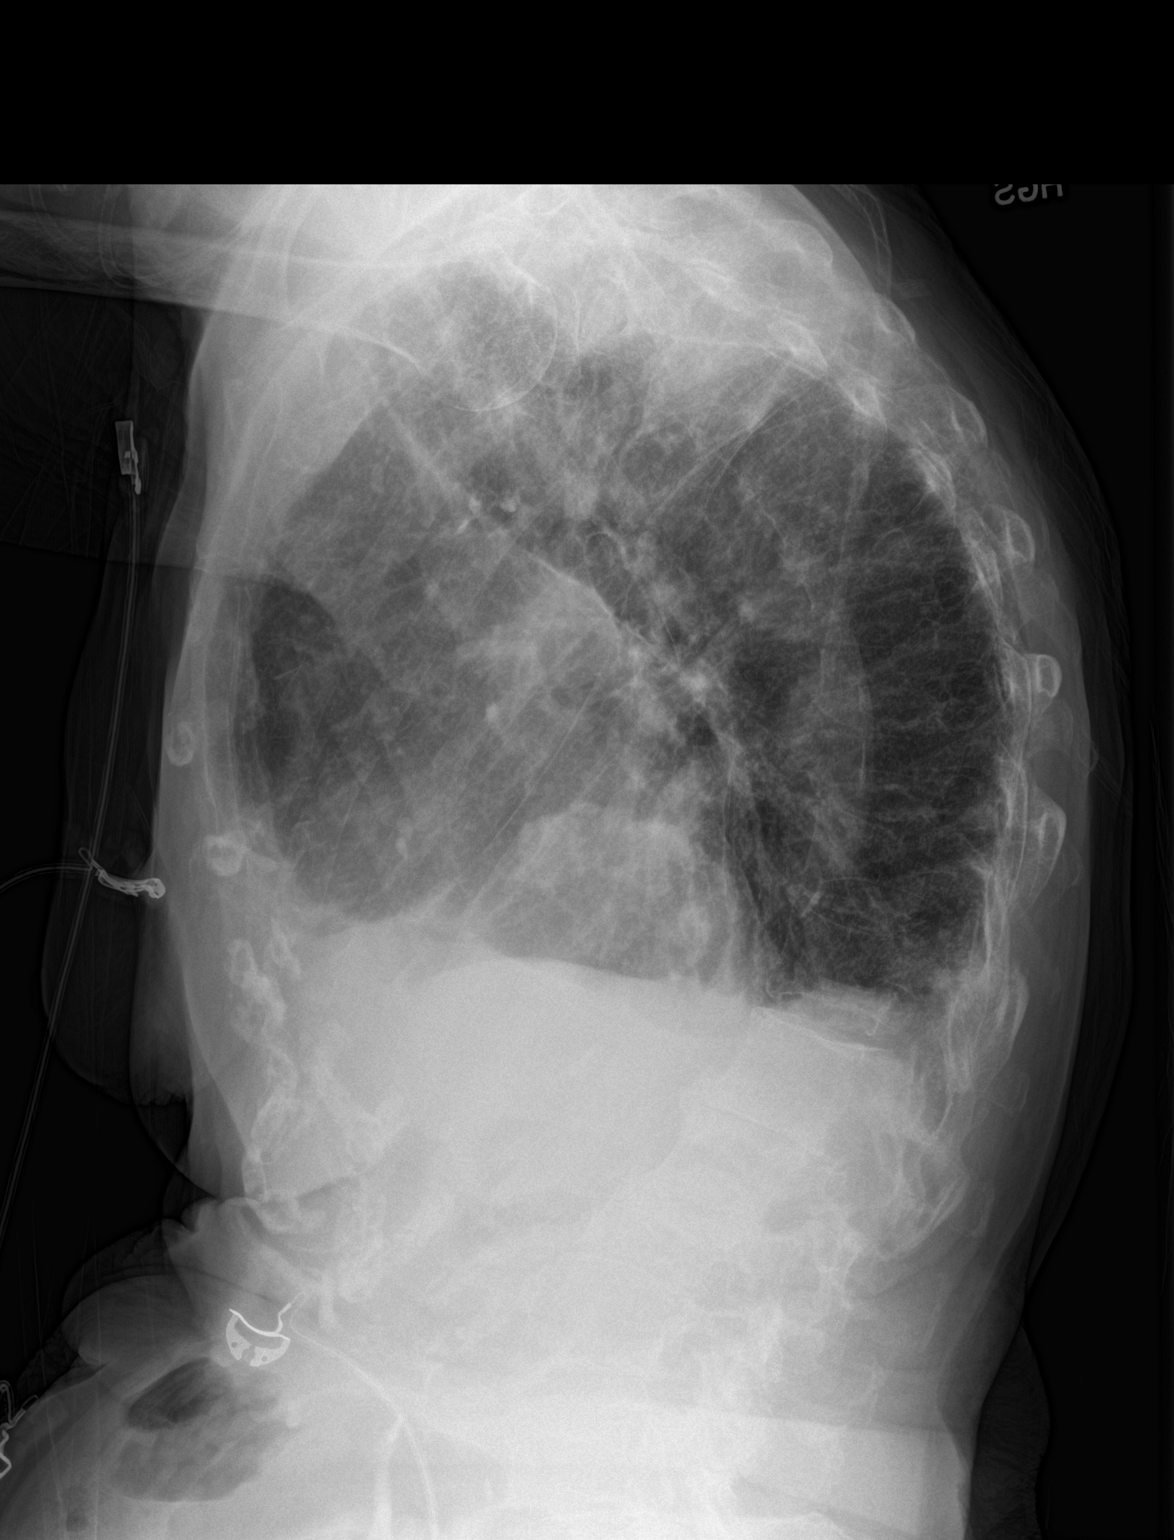

[2 of 2 positions shown; findings below may reference images not displayed]

FINDINGS: Stable enlarged cardiac silhouette. Valvular calcifications noted.
Lungs are hyperinflated. There is new airspace disease in the right
upper lobe and right lower lobe. Osteopenia and chronic thoracic
compression fractures noted.
IMPRESSION: 1. New right upper lobe and right lower lobe pneumonia.
2. Hyperinflated lungs.

## 2016-02-11 DIAGNOSIS — F419 Anxiety disorder, unspecified: Secondary | ICD-10-CM | POA: Diagnosis not present

## 2016-02-11 DIAGNOSIS — I429 Cardiomyopathy, unspecified: Secondary | ICD-10-CM | POA: Diagnosis not present

## 2016-02-11 DIAGNOSIS — I10 Essential (primary) hypertension: Secondary | ICD-10-CM | POA: Diagnosis not present

## 2016-02-11 DIAGNOSIS — Z9981 Dependence on supplemental oxygen: Secondary | ICD-10-CM | POA: Diagnosis not present

## 2016-02-11 DIAGNOSIS — F0390 Unspecified dementia without behavioral disturbance: Secondary | ICD-10-CM | POA: Diagnosis not present

## 2016-02-11 DIAGNOSIS — M1990 Unspecified osteoarthritis, unspecified site: Secondary | ICD-10-CM | POA: Diagnosis not present

## 2016-02-12 DIAGNOSIS — F0390 Unspecified dementia without behavioral disturbance: Secondary | ICD-10-CM | POA: Diagnosis not present

## 2016-02-12 DIAGNOSIS — Z9981 Dependence on supplemental oxygen: Secondary | ICD-10-CM | POA: Diagnosis not present

## 2016-02-12 DIAGNOSIS — I429 Cardiomyopathy, unspecified: Secondary | ICD-10-CM | POA: Diagnosis not present

## 2016-02-12 DIAGNOSIS — I10 Essential (primary) hypertension: Secondary | ICD-10-CM | POA: Diagnosis not present

## 2016-02-12 DIAGNOSIS — F419 Anxiety disorder, unspecified: Secondary | ICD-10-CM | POA: Diagnosis not present

## 2016-02-12 DIAGNOSIS — M1990 Unspecified osteoarthritis, unspecified site: Secondary | ICD-10-CM | POA: Diagnosis not present

## 2016-02-13 DIAGNOSIS — Z9981 Dependence on supplemental oxygen: Secondary | ICD-10-CM | POA: Diagnosis not present

## 2016-02-13 DIAGNOSIS — I10 Essential (primary) hypertension: Secondary | ICD-10-CM | POA: Diagnosis not present

## 2016-02-13 DIAGNOSIS — M1990 Unspecified osteoarthritis, unspecified site: Secondary | ICD-10-CM | POA: Diagnosis not present

## 2016-02-13 DIAGNOSIS — F419 Anxiety disorder, unspecified: Secondary | ICD-10-CM | POA: Diagnosis not present

## 2016-02-13 DIAGNOSIS — I429 Cardiomyopathy, unspecified: Secondary | ICD-10-CM | POA: Diagnosis not present

## 2016-02-13 DIAGNOSIS — F0390 Unspecified dementia without behavioral disturbance: Secondary | ICD-10-CM | POA: Diagnosis not present

## 2016-02-14 DIAGNOSIS — F419 Anxiety disorder, unspecified: Secondary | ICD-10-CM | POA: Diagnosis not present

## 2016-02-14 DIAGNOSIS — M1990 Unspecified osteoarthritis, unspecified site: Secondary | ICD-10-CM | POA: Diagnosis not present

## 2016-02-14 DIAGNOSIS — Z9981 Dependence on supplemental oxygen: Secondary | ICD-10-CM | POA: Diagnosis not present

## 2016-02-14 DIAGNOSIS — F0390 Unspecified dementia without behavioral disturbance: Secondary | ICD-10-CM | POA: Diagnosis not present

## 2016-02-14 DIAGNOSIS — I429 Cardiomyopathy, unspecified: Secondary | ICD-10-CM | POA: Diagnosis not present

## 2016-02-14 DIAGNOSIS — I10 Essential (primary) hypertension: Secondary | ICD-10-CM | POA: Diagnosis not present

## 2016-02-17 DIAGNOSIS — F419 Anxiety disorder, unspecified: Secondary | ICD-10-CM | POA: Diagnosis not present

## 2016-02-17 DIAGNOSIS — Z9981 Dependence on supplemental oxygen: Secondary | ICD-10-CM | POA: Diagnosis not present

## 2016-02-17 DIAGNOSIS — F0390 Unspecified dementia without behavioral disturbance: Secondary | ICD-10-CM | POA: Diagnosis not present

## 2016-02-17 DIAGNOSIS — I429 Cardiomyopathy, unspecified: Secondary | ICD-10-CM | POA: Diagnosis not present

## 2016-02-17 DIAGNOSIS — M1990 Unspecified osteoarthritis, unspecified site: Secondary | ICD-10-CM | POA: Diagnosis not present

## 2016-02-17 DIAGNOSIS — I10 Essential (primary) hypertension: Secondary | ICD-10-CM | POA: Diagnosis not present

## 2016-02-18 DIAGNOSIS — I10 Essential (primary) hypertension: Secondary | ICD-10-CM | POA: Diagnosis not present

## 2016-02-18 DIAGNOSIS — Z9981 Dependence on supplemental oxygen: Secondary | ICD-10-CM | POA: Diagnosis not present

## 2016-02-18 DIAGNOSIS — F0390 Unspecified dementia without behavioral disturbance: Secondary | ICD-10-CM | POA: Diagnosis not present

## 2016-02-18 DIAGNOSIS — M1990 Unspecified osteoarthritis, unspecified site: Secondary | ICD-10-CM | POA: Diagnosis not present

## 2016-02-18 DIAGNOSIS — I429 Cardiomyopathy, unspecified: Secondary | ICD-10-CM | POA: Diagnosis not present

## 2016-02-18 DIAGNOSIS — F419 Anxiety disorder, unspecified: Secondary | ICD-10-CM | POA: Diagnosis not present

## 2016-02-19 DIAGNOSIS — I429 Cardiomyopathy, unspecified: Secondary | ICD-10-CM | POA: Diagnosis not present

## 2016-02-19 DIAGNOSIS — Z9981 Dependence on supplemental oxygen: Secondary | ICD-10-CM | POA: Diagnosis not present

## 2016-02-19 DIAGNOSIS — I10 Essential (primary) hypertension: Secondary | ICD-10-CM | POA: Diagnosis not present

## 2016-02-19 DIAGNOSIS — F419 Anxiety disorder, unspecified: Secondary | ICD-10-CM | POA: Diagnosis not present

## 2016-02-19 DIAGNOSIS — M1990 Unspecified osteoarthritis, unspecified site: Secondary | ICD-10-CM | POA: Diagnosis not present

## 2016-02-19 DIAGNOSIS — F0390 Unspecified dementia without behavioral disturbance: Secondary | ICD-10-CM | POA: Diagnosis not present

## 2016-02-20 DIAGNOSIS — Z9981 Dependence on supplemental oxygen: Secondary | ICD-10-CM | POA: Diagnosis not present

## 2016-02-20 DIAGNOSIS — I429 Cardiomyopathy, unspecified: Secondary | ICD-10-CM | POA: Diagnosis not present

## 2016-02-20 DIAGNOSIS — I10 Essential (primary) hypertension: Secondary | ICD-10-CM | POA: Diagnosis not present

## 2016-02-20 DIAGNOSIS — F0390 Unspecified dementia without behavioral disturbance: Secondary | ICD-10-CM | POA: Diagnosis not present

## 2016-02-20 DIAGNOSIS — M1990 Unspecified osteoarthritis, unspecified site: Secondary | ICD-10-CM | POA: Diagnosis not present

## 2016-02-20 DIAGNOSIS — F419 Anxiety disorder, unspecified: Secondary | ICD-10-CM | POA: Diagnosis not present

## 2016-02-21 DIAGNOSIS — F419 Anxiety disorder, unspecified: Secondary | ICD-10-CM | POA: Diagnosis not present

## 2016-02-21 DIAGNOSIS — I10 Essential (primary) hypertension: Secondary | ICD-10-CM | POA: Diagnosis not present

## 2016-02-21 DIAGNOSIS — Z9981 Dependence on supplemental oxygen: Secondary | ICD-10-CM | POA: Diagnosis not present

## 2016-02-21 DIAGNOSIS — F0390 Unspecified dementia without behavioral disturbance: Secondary | ICD-10-CM | POA: Diagnosis not present

## 2016-02-21 DIAGNOSIS — M1990 Unspecified osteoarthritis, unspecified site: Secondary | ICD-10-CM | POA: Diagnosis not present

## 2016-02-21 DIAGNOSIS — I429 Cardiomyopathy, unspecified: Secondary | ICD-10-CM | POA: Diagnosis not present

## 2016-02-22 DIAGNOSIS — Z9981 Dependence on supplemental oxygen: Secondary | ICD-10-CM | POA: Diagnosis not present

## 2016-02-22 DIAGNOSIS — F0390 Unspecified dementia without behavioral disturbance: Secondary | ICD-10-CM | POA: Diagnosis not present

## 2016-02-22 DIAGNOSIS — I429 Cardiomyopathy, unspecified: Secondary | ICD-10-CM | POA: Diagnosis not present

## 2016-02-22 DIAGNOSIS — F419 Anxiety disorder, unspecified: Secondary | ICD-10-CM | POA: Diagnosis not present

## 2016-02-22 DIAGNOSIS — M1990 Unspecified osteoarthritis, unspecified site: Secondary | ICD-10-CM | POA: Diagnosis not present

## 2016-02-22 DIAGNOSIS — I10 Essential (primary) hypertension: Secondary | ICD-10-CM | POA: Diagnosis not present

## 2016-02-24 DIAGNOSIS — F0390 Unspecified dementia without behavioral disturbance: Secondary | ICD-10-CM | POA: Diagnosis not present

## 2016-02-24 DIAGNOSIS — M1990 Unspecified osteoarthritis, unspecified site: Secondary | ICD-10-CM | POA: Diagnosis not present

## 2016-02-24 DIAGNOSIS — Z9981 Dependence on supplemental oxygen: Secondary | ICD-10-CM | POA: Diagnosis not present

## 2016-02-24 DIAGNOSIS — I429 Cardiomyopathy, unspecified: Secondary | ICD-10-CM | POA: Diagnosis not present

## 2016-02-24 DIAGNOSIS — I10 Essential (primary) hypertension: Secondary | ICD-10-CM | POA: Diagnosis not present

## 2016-02-24 DIAGNOSIS — F419 Anxiety disorder, unspecified: Secondary | ICD-10-CM | POA: Diagnosis not present

## 2016-02-25 DIAGNOSIS — I429 Cardiomyopathy, unspecified: Secondary | ICD-10-CM | POA: Diagnosis not present

## 2016-02-25 DIAGNOSIS — F0390 Unspecified dementia without behavioral disturbance: Secondary | ICD-10-CM | POA: Diagnosis not present

## 2016-02-25 DIAGNOSIS — M1990 Unspecified osteoarthritis, unspecified site: Secondary | ICD-10-CM | POA: Diagnosis not present

## 2016-02-25 DIAGNOSIS — I10 Essential (primary) hypertension: Secondary | ICD-10-CM | POA: Diagnosis not present

## 2016-02-25 DIAGNOSIS — F419 Anxiety disorder, unspecified: Secondary | ICD-10-CM | POA: Diagnosis not present

## 2016-02-25 DIAGNOSIS — Z9981 Dependence on supplemental oxygen: Secondary | ICD-10-CM | POA: Diagnosis not present

## 2016-02-26 DIAGNOSIS — M1990 Unspecified osteoarthritis, unspecified site: Secondary | ICD-10-CM | POA: Diagnosis not present

## 2016-02-26 DIAGNOSIS — I429 Cardiomyopathy, unspecified: Secondary | ICD-10-CM | POA: Diagnosis not present

## 2016-02-26 DIAGNOSIS — F0390 Unspecified dementia without behavioral disturbance: Secondary | ICD-10-CM | POA: Diagnosis not present

## 2016-02-26 DIAGNOSIS — I10 Essential (primary) hypertension: Secondary | ICD-10-CM | POA: Diagnosis not present

## 2016-02-26 DIAGNOSIS — Z9981 Dependence on supplemental oxygen: Secondary | ICD-10-CM | POA: Diagnosis not present

## 2016-02-26 DIAGNOSIS — F419 Anxiety disorder, unspecified: Secondary | ICD-10-CM | POA: Diagnosis not present

## 2016-02-27 DIAGNOSIS — Z9981 Dependence on supplemental oxygen: Secondary | ICD-10-CM | POA: Diagnosis not present

## 2016-02-27 DIAGNOSIS — I10 Essential (primary) hypertension: Secondary | ICD-10-CM | POA: Diagnosis not present

## 2016-02-27 DIAGNOSIS — F0390 Unspecified dementia without behavioral disturbance: Secondary | ICD-10-CM | POA: Diagnosis not present

## 2016-02-27 DIAGNOSIS — I429 Cardiomyopathy, unspecified: Secondary | ICD-10-CM | POA: Diagnosis not present

## 2016-02-27 DIAGNOSIS — M1990 Unspecified osteoarthritis, unspecified site: Secondary | ICD-10-CM | POA: Diagnosis not present

## 2016-02-27 DIAGNOSIS — F419 Anxiety disorder, unspecified: Secondary | ICD-10-CM | POA: Diagnosis not present

## 2016-02-28 DIAGNOSIS — F419 Anxiety disorder, unspecified: Secondary | ICD-10-CM | POA: Diagnosis not present

## 2016-02-28 DIAGNOSIS — I429 Cardiomyopathy, unspecified: Secondary | ICD-10-CM | POA: Diagnosis not present

## 2016-02-28 DIAGNOSIS — I10 Essential (primary) hypertension: Secondary | ICD-10-CM | POA: Diagnosis not present

## 2016-02-28 DIAGNOSIS — M1990 Unspecified osteoarthritis, unspecified site: Secondary | ICD-10-CM | POA: Diagnosis not present

## 2016-02-28 DIAGNOSIS — F0390 Unspecified dementia without behavioral disturbance: Secondary | ICD-10-CM | POA: Diagnosis not present

## 2016-02-28 DIAGNOSIS — Z9981 Dependence on supplemental oxygen: Secondary | ICD-10-CM | POA: Diagnosis not present

## 2016-03-02 DIAGNOSIS — F419 Anxiety disorder, unspecified: Secondary | ICD-10-CM | POA: Diagnosis not present

## 2016-03-02 DIAGNOSIS — I10 Essential (primary) hypertension: Secondary | ICD-10-CM | POA: Diagnosis not present

## 2016-03-02 DIAGNOSIS — Z9981 Dependence on supplemental oxygen: Secondary | ICD-10-CM | POA: Diagnosis not present

## 2016-03-02 DIAGNOSIS — M1990 Unspecified osteoarthritis, unspecified site: Secondary | ICD-10-CM | POA: Diagnosis not present

## 2016-03-02 DIAGNOSIS — I429 Cardiomyopathy, unspecified: Secondary | ICD-10-CM | POA: Diagnosis not present

## 2016-03-02 DIAGNOSIS — F0390 Unspecified dementia without behavioral disturbance: Secondary | ICD-10-CM | POA: Diagnosis not present

## 2016-03-03 DIAGNOSIS — F419 Anxiety disorder, unspecified: Secondary | ICD-10-CM | POA: Diagnosis not present

## 2016-03-03 DIAGNOSIS — I429 Cardiomyopathy, unspecified: Secondary | ICD-10-CM | POA: Diagnosis not present

## 2016-03-03 DIAGNOSIS — I10 Essential (primary) hypertension: Secondary | ICD-10-CM | POA: Diagnosis not present

## 2016-03-03 DIAGNOSIS — Z9981 Dependence on supplemental oxygen: Secondary | ICD-10-CM | POA: Diagnosis not present

## 2016-03-03 DIAGNOSIS — F0390 Unspecified dementia without behavioral disturbance: Secondary | ICD-10-CM | POA: Diagnosis not present

## 2016-03-03 DIAGNOSIS — M1990 Unspecified osteoarthritis, unspecified site: Secondary | ICD-10-CM | POA: Diagnosis not present

## 2016-03-04 DIAGNOSIS — F0390 Unspecified dementia without behavioral disturbance: Secondary | ICD-10-CM | POA: Diagnosis not present

## 2016-03-04 DIAGNOSIS — I429 Cardiomyopathy, unspecified: Secondary | ICD-10-CM | POA: Diagnosis not present

## 2016-03-04 DIAGNOSIS — Z9981 Dependence on supplemental oxygen: Secondary | ICD-10-CM | POA: Diagnosis not present

## 2016-03-04 DIAGNOSIS — M1990 Unspecified osteoarthritis, unspecified site: Secondary | ICD-10-CM | POA: Diagnosis not present

## 2016-03-04 DIAGNOSIS — I10 Essential (primary) hypertension: Secondary | ICD-10-CM | POA: Diagnosis not present

## 2016-03-04 DIAGNOSIS — F419 Anxiety disorder, unspecified: Secondary | ICD-10-CM | POA: Diagnosis not present

## 2016-03-05 DIAGNOSIS — I872 Venous insufficiency (chronic) (peripheral): Secondary | ICD-10-CM | POA: Diagnosis not present

## 2016-03-05 DIAGNOSIS — M810 Age-related osteoporosis without current pathological fracture: Secondary | ICD-10-CM | POA: Diagnosis not present

## 2016-03-05 DIAGNOSIS — I429 Cardiomyopathy, unspecified: Secondary | ICD-10-CM | POA: Diagnosis not present

## 2016-03-05 DIAGNOSIS — K219 Gastro-esophageal reflux disease without esophagitis: Secondary | ICD-10-CM | POA: Diagnosis not present

## 2016-03-05 DIAGNOSIS — Z87891 Personal history of nicotine dependence: Secondary | ICD-10-CM | POA: Diagnosis not present

## 2016-03-05 DIAGNOSIS — F0390 Unspecified dementia without behavioral disturbance: Secondary | ICD-10-CM | POA: Diagnosis not present

## 2016-03-05 DIAGNOSIS — M1990 Unspecified osteoarthritis, unspecified site: Secondary | ICD-10-CM | POA: Diagnosis not present

## 2016-03-05 DIAGNOSIS — F419 Anxiety disorder, unspecified: Secondary | ICD-10-CM | POA: Diagnosis not present

## 2016-03-05 DIAGNOSIS — E785 Hyperlipidemia, unspecified: Secondary | ICD-10-CM | POA: Diagnosis not present

## 2016-03-05 DIAGNOSIS — I10 Essential (primary) hypertension: Secondary | ICD-10-CM | POA: Diagnosis not present

## 2016-03-05 DIAGNOSIS — K589 Irritable bowel syndrome without diarrhea: Secondary | ICD-10-CM | POA: Diagnosis not present

## 2016-03-05 DIAGNOSIS — E039 Hypothyroidism, unspecified: Secondary | ICD-10-CM | POA: Diagnosis not present

## 2016-03-05 DIAGNOSIS — Z9981 Dependence on supplemental oxygen: Secondary | ICD-10-CM | POA: Diagnosis not present

## 2016-03-06 DIAGNOSIS — I10 Essential (primary) hypertension: Secondary | ICD-10-CM | POA: Diagnosis not present

## 2016-03-06 DIAGNOSIS — M1990 Unspecified osteoarthritis, unspecified site: Secondary | ICD-10-CM | POA: Diagnosis not present

## 2016-03-06 DIAGNOSIS — I429 Cardiomyopathy, unspecified: Secondary | ICD-10-CM | POA: Diagnosis not present

## 2016-03-06 DIAGNOSIS — F0390 Unspecified dementia without behavioral disturbance: Secondary | ICD-10-CM | POA: Diagnosis not present

## 2016-03-06 DIAGNOSIS — F419 Anxiety disorder, unspecified: Secondary | ICD-10-CM | POA: Diagnosis not present

## 2016-03-06 DIAGNOSIS — Z9981 Dependence on supplemental oxygen: Secondary | ICD-10-CM | POA: Diagnosis not present

## 2016-03-09 DIAGNOSIS — M1990 Unspecified osteoarthritis, unspecified site: Secondary | ICD-10-CM | POA: Diagnosis not present

## 2016-03-09 DIAGNOSIS — F0390 Unspecified dementia without behavioral disturbance: Secondary | ICD-10-CM | POA: Diagnosis not present

## 2016-03-09 DIAGNOSIS — I10 Essential (primary) hypertension: Secondary | ICD-10-CM | POA: Diagnosis not present

## 2016-03-09 DIAGNOSIS — Z9981 Dependence on supplemental oxygen: Secondary | ICD-10-CM | POA: Diagnosis not present

## 2016-03-09 DIAGNOSIS — I429 Cardiomyopathy, unspecified: Secondary | ICD-10-CM | POA: Diagnosis not present

## 2016-03-09 DIAGNOSIS — F419 Anxiety disorder, unspecified: Secondary | ICD-10-CM | POA: Diagnosis not present

## 2016-03-10 DIAGNOSIS — M1990 Unspecified osteoarthritis, unspecified site: Secondary | ICD-10-CM | POA: Diagnosis not present

## 2016-03-10 DIAGNOSIS — Z9981 Dependence on supplemental oxygen: Secondary | ICD-10-CM | POA: Diagnosis not present

## 2016-03-10 DIAGNOSIS — F0390 Unspecified dementia without behavioral disturbance: Secondary | ICD-10-CM | POA: Diagnosis not present

## 2016-03-10 DIAGNOSIS — F419 Anxiety disorder, unspecified: Secondary | ICD-10-CM | POA: Diagnosis not present

## 2016-03-10 DIAGNOSIS — I429 Cardiomyopathy, unspecified: Secondary | ICD-10-CM | POA: Diagnosis not present

## 2016-03-10 DIAGNOSIS — I10 Essential (primary) hypertension: Secondary | ICD-10-CM | POA: Diagnosis not present

## 2016-03-11 DIAGNOSIS — F419 Anxiety disorder, unspecified: Secondary | ICD-10-CM | POA: Diagnosis not present

## 2016-03-11 DIAGNOSIS — I429 Cardiomyopathy, unspecified: Secondary | ICD-10-CM | POA: Diagnosis not present

## 2016-03-11 DIAGNOSIS — M1990 Unspecified osteoarthritis, unspecified site: Secondary | ICD-10-CM | POA: Diagnosis not present

## 2016-03-11 DIAGNOSIS — I10 Essential (primary) hypertension: Secondary | ICD-10-CM | POA: Diagnosis not present

## 2016-03-11 DIAGNOSIS — Z9981 Dependence on supplemental oxygen: Secondary | ICD-10-CM | POA: Diagnosis not present

## 2016-03-11 DIAGNOSIS — F0390 Unspecified dementia without behavioral disturbance: Secondary | ICD-10-CM | POA: Diagnosis not present

## 2016-03-12 DIAGNOSIS — I429 Cardiomyopathy, unspecified: Secondary | ICD-10-CM | POA: Diagnosis not present

## 2016-03-12 DIAGNOSIS — F0390 Unspecified dementia without behavioral disturbance: Secondary | ICD-10-CM | POA: Diagnosis not present

## 2016-03-12 DIAGNOSIS — F419 Anxiety disorder, unspecified: Secondary | ICD-10-CM | POA: Diagnosis not present

## 2016-03-12 DIAGNOSIS — I10 Essential (primary) hypertension: Secondary | ICD-10-CM | POA: Diagnosis not present

## 2016-03-12 DIAGNOSIS — Z9981 Dependence on supplemental oxygen: Secondary | ICD-10-CM | POA: Diagnosis not present

## 2016-03-12 DIAGNOSIS — M1990 Unspecified osteoarthritis, unspecified site: Secondary | ICD-10-CM | POA: Diagnosis not present

## 2016-03-13 DIAGNOSIS — F0390 Unspecified dementia without behavioral disturbance: Secondary | ICD-10-CM | POA: Diagnosis not present

## 2016-03-13 DIAGNOSIS — I10 Essential (primary) hypertension: Secondary | ICD-10-CM | POA: Diagnosis not present

## 2016-03-13 DIAGNOSIS — Z9981 Dependence on supplemental oxygen: Secondary | ICD-10-CM | POA: Diagnosis not present

## 2016-03-13 DIAGNOSIS — M1990 Unspecified osteoarthritis, unspecified site: Secondary | ICD-10-CM | POA: Diagnosis not present

## 2016-03-13 DIAGNOSIS — I429 Cardiomyopathy, unspecified: Secondary | ICD-10-CM | POA: Diagnosis not present

## 2016-03-13 DIAGNOSIS — F419 Anxiety disorder, unspecified: Secondary | ICD-10-CM | POA: Diagnosis not present

## 2016-03-16 DIAGNOSIS — I429 Cardiomyopathy, unspecified: Secondary | ICD-10-CM | POA: Diagnosis not present

## 2016-03-16 DIAGNOSIS — M1990 Unspecified osteoarthritis, unspecified site: Secondary | ICD-10-CM | POA: Diagnosis not present

## 2016-03-16 DIAGNOSIS — Z9981 Dependence on supplemental oxygen: Secondary | ICD-10-CM | POA: Diagnosis not present

## 2016-03-16 DIAGNOSIS — F0390 Unspecified dementia without behavioral disturbance: Secondary | ICD-10-CM | POA: Diagnosis not present

## 2016-03-16 DIAGNOSIS — I10 Essential (primary) hypertension: Secondary | ICD-10-CM | POA: Diagnosis not present

## 2016-03-16 DIAGNOSIS — F419 Anxiety disorder, unspecified: Secondary | ICD-10-CM | POA: Diagnosis not present

## 2016-03-17 DIAGNOSIS — M1990 Unspecified osteoarthritis, unspecified site: Secondary | ICD-10-CM | POA: Diagnosis not present

## 2016-03-17 DIAGNOSIS — F419 Anxiety disorder, unspecified: Secondary | ICD-10-CM | POA: Diagnosis not present

## 2016-03-17 DIAGNOSIS — I429 Cardiomyopathy, unspecified: Secondary | ICD-10-CM | POA: Diagnosis not present

## 2016-03-17 DIAGNOSIS — Z9981 Dependence on supplemental oxygen: Secondary | ICD-10-CM | POA: Diagnosis not present

## 2016-03-17 DIAGNOSIS — I10 Essential (primary) hypertension: Secondary | ICD-10-CM | POA: Diagnosis not present

## 2016-03-17 DIAGNOSIS — F0390 Unspecified dementia without behavioral disturbance: Secondary | ICD-10-CM | POA: Diagnosis not present

## 2016-03-18 DIAGNOSIS — I429 Cardiomyopathy, unspecified: Secondary | ICD-10-CM | POA: Diagnosis not present

## 2016-03-18 DIAGNOSIS — I10 Essential (primary) hypertension: Secondary | ICD-10-CM | POA: Diagnosis not present

## 2016-03-18 DIAGNOSIS — F0390 Unspecified dementia without behavioral disturbance: Secondary | ICD-10-CM | POA: Diagnosis not present

## 2016-03-18 DIAGNOSIS — M1990 Unspecified osteoarthritis, unspecified site: Secondary | ICD-10-CM | POA: Diagnosis not present

## 2016-03-18 DIAGNOSIS — F419 Anxiety disorder, unspecified: Secondary | ICD-10-CM | POA: Diagnosis not present

## 2016-03-18 DIAGNOSIS — Z9981 Dependence on supplemental oxygen: Secondary | ICD-10-CM | POA: Diagnosis not present

## 2016-03-19 DIAGNOSIS — M1990 Unspecified osteoarthritis, unspecified site: Secondary | ICD-10-CM | POA: Diagnosis not present

## 2016-03-19 DIAGNOSIS — I10 Essential (primary) hypertension: Secondary | ICD-10-CM | POA: Diagnosis not present

## 2016-03-19 DIAGNOSIS — I429 Cardiomyopathy, unspecified: Secondary | ICD-10-CM | POA: Diagnosis not present

## 2016-03-19 DIAGNOSIS — Z9981 Dependence on supplemental oxygen: Secondary | ICD-10-CM | POA: Diagnosis not present

## 2016-03-19 DIAGNOSIS — F419 Anxiety disorder, unspecified: Secondary | ICD-10-CM | POA: Diagnosis not present

## 2016-03-19 DIAGNOSIS — F0390 Unspecified dementia without behavioral disturbance: Secondary | ICD-10-CM | POA: Diagnosis not present

## 2016-03-20 DIAGNOSIS — I429 Cardiomyopathy, unspecified: Secondary | ICD-10-CM | POA: Diagnosis not present

## 2016-03-20 DIAGNOSIS — F419 Anxiety disorder, unspecified: Secondary | ICD-10-CM | POA: Diagnosis not present

## 2016-03-20 DIAGNOSIS — Z9981 Dependence on supplemental oxygen: Secondary | ICD-10-CM | POA: Diagnosis not present

## 2016-03-20 DIAGNOSIS — I10 Essential (primary) hypertension: Secondary | ICD-10-CM | POA: Diagnosis not present

## 2016-03-20 DIAGNOSIS — M1990 Unspecified osteoarthritis, unspecified site: Secondary | ICD-10-CM | POA: Diagnosis not present

## 2016-03-20 DIAGNOSIS — F0390 Unspecified dementia without behavioral disturbance: Secondary | ICD-10-CM | POA: Diagnosis not present

## 2016-03-23 DIAGNOSIS — Z9981 Dependence on supplemental oxygen: Secondary | ICD-10-CM | POA: Diagnosis not present

## 2016-03-23 DIAGNOSIS — M1990 Unspecified osteoarthritis, unspecified site: Secondary | ICD-10-CM | POA: Diagnosis not present

## 2016-03-23 DIAGNOSIS — I10 Essential (primary) hypertension: Secondary | ICD-10-CM | POA: Diagnosis not present

## 2016-03-23 DIAGNOSIS — F419 Anxiety disorder, unspecified: Secondary | ICD-10-CM | POA: Diagnosis not present

## 2016-03-23 DIAGNOSIS — I429 Cardiomyopathy, unspecified: Secondary | ICD-10-CM | POA: Diagnosis not present

## 2016-03-23 DIAGNOSIS — F0390 Unspecified dementia without behavioral disturbance: Secondary | ICD-10-CM | POA: Diagnosis not present

## 2016-03-24 DIAGNOSIS — F0390 Unspecified dementia without behavioral disturbance: Secondary | ICD-10-CM | POA: Diagnosis not present

## 2016-03-24 DIAGNOSIS — I429 Cardiomyopathy, unspecified: Secondary | ICD-10-CM | POA: Diagnosis not present

## 2016-03-24 DIAGNOSIS — I10 Essential (primary) hypertension: Secondary | ICD-10-CM | POA: Diagnosis not present

## 2016-03-24 DIAGNOSIS — M1990 Unspecified osteoarthritis, unspecified site: Secondary | ICD-10-CM | POA: Diagnosis not present

## 2016-03-24 DIAGNOSIS — Z9981 Dependence on supplemental oxygen: Secondary | ICD-10-CM | POA: Diagnosis not present

## 2016-03-24 DIAGNOSIS — F419 Anxiety disorder, unspecified: Secondary | ICD-10-CM | POA: Diagnosis not present

## 2016-03-25 DIAGNOSIS — F0390 Unspecified dementia without behavioral disturbance: Secondary | ICD-10-CM | POA: Diagnosis not present

## 2016-03-25 DIAGNOSIS — Z9981 Dependence on supplemental oxygen: Secondary | ICD-10-CM | POA: Diagnosis not present

## 2016-03-25 DIAGNOSIS — I429 Cardiomyopathy, unspecified: Secondary | ICD-10-CM | POA: Diagnosis not present

## 2016-03-25 DIAGNOSIS — F419 Anxiety disorder, unspecified: Secondary | ICD-10-CM | POA: Diagnosis not present

## 2016-03-25 DIAGNOSIS — I10 Essential (primary) hypertension: Secondary | ICD-10-CM | POA: Diagnosis not present

## 2016-03-25 DIAGNOSIS — M1990 Unspecified osteoarthritis, unspecified site: Secondary | ICD-10-CM | POA: Diagnosis not present

## 2016-03-26 DIAGNOSIS — I10 Essential (primary) hypertension: Secondary | ICD-10-CM | POA: Diagnosis not present

## 2016-03-26 DIAGNOSIS — Z9981 Dependence on supplemental oxygen: Secondary | ICD-10-CM | POA: Diagnosis not present

## 2016-03-26 DIAGNOSIS — F419 Anxiety disorder, unspecified: Secondary | ICD-10-CM | POA: Diagnosis not present

## 2016-03-26 DIAGNOSIS — M1990 Unspecified osteoarthritis, unspecified site: Secondary | ICD-10-CM | POA: Diagnosis not present

## 2016-03-26 DIAGNOSIS — F0390 Unspecified dementia without behavioral disturbance: Secondary | ICD-10-CM | POA: Diagnosis not present

## 2016-03-26 DIAGNOSIS — S0501XA Injury of conjunctiva and corneal abrasion without foreign body, right eye, initial encounter: Secondary | ICD-10-CM | POA: Diagnosis not present

## 2016-03-26 DIAGNOSIS — I429 Cardiomyopathy, unspecified: Secondary | ICD-10-CM | POA: Diagnosis not present

## 2016-03-27 DIAGNOSIS — Z9981 Dependence on supplemental oxygen: Secondary | ICD-10-CM | POA: Diagnosis not present

## 2016-03-27 DIAGNOSIS — M1990 Unspecified osteoarthritis, unspecified site: Secondary | ICD-10-CM | POA: Diagnosis not present

## 2016-03-27 DIAGNOSIS — I429 Cardiomyopathy, unspecified: Secondary | ICD-10-CM | POA: Diagnosis not present

## 2016-03-27 DIAGNOSIS — I10 Essential (primary) hypertension: Secondary | ICD-10-CM | POA: Diagnosis not present

## 2016-03-27 DIAGNOSIS — F419 Anxiety disorder, unspecified: Secondary | ICD-10-CM | POA: Diagnosis not present

## 2016-03-27 DIAGNOSIS — F0390 Unspecified dementia without behavioral disturbance: Secondary | ICD-10-CM | POA: Diagnosis not present

## 2016-03-30 DIAGNOSIS — H16001 Unspecified corneal ulcer, right eye: Secondary | ICD-10-CM | POA: Diagnosis not present

## 2016-03-30 DIAGNOSIS — J9611 Chronic respiratory failure with hypoxia: Secondary | ICD-10-CM | POA: Diagnosis not present

## 2016-03-30 DIAGNOSIS — I5022 Chronic systolic (congestive) heart failure: Secondary | ICD-10-CM | POA: Diagnosis not present

## 2016-03-30 DIAGNOSIS — I10 Essential (primary) hypertension: Secondary | ICD-10-CM | POA: Diagnosis not present

## 2016-03-30 DIAGNOSIS — Z9981 Dependence on supplemental oxygen: Secondary | ICD-10-CM | POA: Diagnosis not present

## 2016-03-30 DIAGNOSIS — F39 Unspecified mood [affective] disorder: Secondary | ICD-10-CM | POA: Diagnosis not present

## 2016-03-30 DIAGNOSIS — F0150 Vascular dementia without behavioral disturbance: Secondary | ICD-10-CM | POA: Diagnosis not present

## 2016-03-30 DIAGNOSIS — I429 Cardiomyopathy, unspecified: Secondary | ICD-10-CM | POA: Diagnosis not present

## 2016-03-30 DIAGNOSIS — F419 Anxiety disorder, unspecified: Secondary | ICD-10-CM | POA: Diagnosis not present

## 2016-03-30 DIAGNOSIS — F0390 Unspecified dementia without behavioral disturbance: Secondary | ICD-10-CM | POA: Diagnosis not present

## 2016-03-30 DIAGNOSIS — I270 Primary pulmonary hypertension: Secondary | ICD-10-CM | POA: Diagnosis not present

## 2016-03-30 DIAGNOSIS — M1990 Unspecified osteoarthritis, unspecified site: Secondary | ICD-10-CM | POA: Diagnosis not present

## 2016-03-31 DIAGNOSIS — Z9981 Dependence on supplemental oxygen: Secondary | ICD-10-CM | POA: Diagnosis not present

## 2016-03-31 DIAGNOSIS — M1990 Unspecified osteoarthritis, unspecified site: Secondary | ICD-10-CM | POA: Diagnosis not present

## 2016-03-31 DIAGNOSIS — F0390 Unspecified dementia without behavioral disturbance: Secondary | ICD-10-CM | POA: Diagnosis not present

## 2016-03-31 DIAGNOSIS — F419 Anxiety disorder, unspecified: Secondary | ICD-10-CM | POA: Diagnosis not present

## 2016-03-31 DIAGNOSIS — I10 Essential (primary) hypertension: Secondary | ICD-10-CM | POA: Diagnosis not present

## 2016-03-31 DIAGNOSIS — I429 Cardiomyopathy, unspecified: Secondary | ICD-10-CM | POA: Diagnosis not present

## 2016-04-01 DIAGNOSIS — Z9981 Dependence on supplemental oxygen: Secondary | ICD-10-CM | POA: Diagnosis not present

## 2016-04-01 DIAGNOSIS — I429 Cardiomyopathy, unspecified: Secondary | ICD-10-CM | POA: Diagnosis not present

## 2016-04-01 DIAGNOSIS — M1990 Unspecified osteoarthritis, unspecified site: Secondary | ICD-10-CM | POA: Diagnosis not present

## 2016-04-01 DIAGNOSIS — I10 Essential (primary) hypertension: Secondary | ICD-10-CM | POA: Diagnosis not present

## 2016-04-01 DIAGNOSIS — F0390 Unspecified dementia without behavioral disturbance: Secondary | ICD-10-CM | POA: Diagnosis not present

## 2016-04-01 DIAGNOSIS — F419 Anxiety disorder, unspecified: Secondary | ICD-10-CM | POA: Diagnosis not present

## 2016-04-02 ENCOUNTER — Other Ambulatory Visit
Admission: RE | Admit: 2016-04-02 | Discharge: 2016-04-02 | Disposition: A | Discharge status: Home / Self Care | Source: Ambulatory Visit | Attending: Ophthalmology | Admitting: Ophthalmology

## 2016-04-02 DIAGNOSIS — I429 Cardiomyopathy, unspecified: Secondary | ICD-10-CM | POA: Diagnosis not present

## 2016-04-02 DIAGNOSIS — F0390 Unspecified dementia without behavioral disturbance: Secondary | ICD-10-CM | POA: Diagnosis not present

## 2016-04-02 DIAGNOSIS — I10 Essential (primary) hypertension: Secondary | ICD-10-CM | POA: Diagnosis not present

## 2016-04-02 DIAGNOSIS — F419 Anxiety disorder, unspecified: Secondary | ICD-10-CM | POA: Diagnosis not present

## 2016-04-02 DIAGNOSIS — H16001 Unspecified corneal ulcer, right eye: Secondary | ICD-10-CM | POA: Diagnosis not present

## 2016-04-02 DIAGNOSIS — Z9981 Dependence on supplemental oxygen: Secondary | ICD-10-CM | POA: Diagnosis not present

## 2016-04-02 DIAGNOSIS — M1990 Unspecified osteoarthritis, unspecified site: Secondary | ICD-10-CM | POA: Diagnosis not present

## 2016-04-03 DIAGNOSIS — I10 Essential (primary) hypertension: Secondary | ICD-10-CM | POA: Diagnosis not present

## 2016-04-03 DIAGNOSIS — M1990 Unspecified osteoarthritis, unspecified site: Secondary | ICD-10-CM | POA: Diagnosis not present

## 2016-04-03 DIAGNOSIS — I429 Cardiomyopathy, unspecified: Secondary | ICD-10-CM | POA: Diagnosis not present

## 2016-04-03 DIAGNOSIS — Z9981 Dependence on supplemental oxygen: Secondary | ICD-10-CM | POA: Diagnosis not present

## 2016-04-03 DIAGNOSIS — F0390 Unspecified dementia without behavioral disturbance: Secondary | ICD-10-CM | POA: Diagnosis not present

## 2016-04-03 DIAGNOSIS — F419 Anxiety disorder, unspecified: Secondary | ICD-10-CM | POA: Diagnosis not present

## 2016-04-05 DIAGNOSIS — I872 Venous insufficiency (chronic) (peripheral): Secondary | ICD-10-CM | POA: Diagnosis not present

## 2016-04-05 DIAGNOSIS — E039 Hypothyroidism, unspecified: Secondary | ICD-10-CM | POA: Diagnosis not present

## 2016-04-05 DIAGNOSIS — K589 Irritable bowel syndrome without diarrhea: Secondary | ICD-10-CM | POA: Diagnosis not present

## 2016-04-05 DIAGNOSIS — Z9981 Dependence on supplemental oxygen: Secondary | ICD-10-CM | POA: Diagnosis not present

## 2016-04-05 DIAGNOSIS — E785 Hyperlipidemia, unspecified: Secondary | ICD-10-CM | POA: Diagnosis not present

## 2016-04-05 DIAGNOSIS — F419 Anxiety disorder, unspecified: Secondary | ICD-10-CM | POA: Diagnosis not present

## 2016-04-05 DIAGNOSIS — M810 Age-related osteoporosis without current pathological fracture: Secondary | ICD-10-CM | POA: Diagnosis not present

## 2016-04-05 DIAGNOSIS — Z87891 Personal history of nicotine dependence: Secondary | ICD-10-CM | POA: Diagnosis not present

## 2016-04-05 DIAGNOSIS — K219 Gastro-esophageal reflux disease without esophagitis: Secondary | ICD-10-CM | POA: Diagnosis not present

## 2016-04-05 DIAGNOSIS — I429 Cardiomyopathy, unspecified: Secondary | ICD-10-CM | POA: Diagnosis not present

## 2016-04-05 DIAGNOSIS — M1990 Unspecified osteoarthritis, unspecified site: Secondary | ICD-10-CM | POA: Diagnosis not present

## 2016-04-05 DIAGNOSIS — F0390 Unspecified dementia without behavioral disturbance: Secondary | ICD-10-CM | POA: Diagnosis not present

## 2016-04-05 DIAGNOSIS — I10 Essential (primary) hypertension: Secondary | ICD-10-CM | POA: Diagnosis not present

## 2016-04-05 LAB — AEROBIC CULTURE  (SUPERFICIAL SPECIMEN)

## 2016-04-05 LAB — AEROBIC CULTURE W GRAM STAIN (SUPERFICIAL SPECIMEN): Culture: NO GROWTH

## 2016-04-06 ENCOUNTER — Other Ambulatory Visit
Admission: RE | Admit: 2016-04-06 | Discharge: 2016-04-06 | Disposition: A | Discharge status: Home / Self Care | Source: Ambulatory Visit | Attending: Ophthalmology | Admitting: Ophthalmology

## 2016-04-06 DIAGNOSIS — I429 Cardiomyopathy, unspecified: Secondary | ICD-10-CM | POA: Diagnosis not present

## 2016-04-06 DIAGNOSIS — H16001 Unspecified corneal ulcer, right eye: Secondary | ICD-10-CM | POA: Diagnosis not present

## 2016-04-06 DIAGNOSIS — M1990 Unspecified osteoarthritis, unspecified site: Secondary | ICD-10-CM | POA: Diagnosis not present

## 2016-04-06 DIAGNOSIS — Z9981 Dependence on supplemental oxygen: Secondary | ICD-10-CM | POA: Diagnosis not present

## 2016-04-06 DIAGNOSIS — F0390 Unspecified dementia without behavioral disturbance: Secondary | ICD-10-CM | POA: Diagnosis not present

## 2016-04-06 DIAGNOSIS — F419 Anxiety disorder, unspecified: Secondary | ICD-10-CM | POA: Diagnosis not present

## 2016-04-06 DIAGNOSIS — I10 Essential (primary) hypertension: Secondary | ICD-10-CM | POA: Diagnosis not present

## 2016-04-07 DIAGNOSIS — Z9981 Dependence on supplemental oxygen: Secondary | ICD-10-CM | POA: Diagnosis not present

## 2016-04-07 DIAGNOSIS — I429 Cardiomyopathy, unspecified: Secondary | ICD-10-CM | POA: Diagnosis not present

## 2016-04-07 DIAGNOSIS — M1990 Unspecified osteoarthritis, unspecified site: Secondary | ICD-10-CM | POA: Diagnosis not present

## 2016-04-07 DIAGNOSIS — F419 Anxiety disorder, unspecified: Secondary | ICD-10-CM | POA: Diagnosis not present

## 2016-04-07 DIAGNOSIS — F0390 Unspecified dementia without behavioral disturbance: Secondary | ICD-10-CM | POA: Diagnosis not present

## 2016-04-07 DIAGNOSIS — I10 Essential (primary) hypertension: Secondary | ICD-10-CM | POA: Diagnosis not present

## 2016-04-08 DIAGNOSIS — M1990 Unspecified osteoarthritis, unspecified site: Secondary | ICD-10-CM | POA: Diagnosis not present

## 2016-04-08 DIAGNOSIS — I10 Essential (primary) hypertension: Secondary | ICD-10-CM | POA: Diagnosis not present

## 2016-04-08 DIAGNOSIS — F419 Anxiety disorder, unspecified: Secondary | ICD-10-CM | POA: Diagnosis not present

## 2016-04-08 DIAGNOSIS — F0390 Unspecified dementia without behavioral disturbance: Secondary | ICD-10-CM | POA: Diagnosis not present

## 2016-04-08 DIAGNOSIS — I429 Cardiomyopathy, unspecified: Secondary | ICD-10-CM | POA: Diagnosis not present

## 2016-04-08 DIAGNOSIS — Z9981 Dependence on supplemental oxygen: Secondary | ICD-10-CM | POA: Diagnosis not present

## 2016-04-09 DIAGNOSIS — M1990 Unspecified osteoarthritis, unspecified site: Secondary | ICD-10-CM | POA: Diagnosis not present

## 2016-04-09 DIAGNOSIS — Z9981 Dependence on supplemental oxygen: Secondary | ICD-10-CM | POA: Diagnosis not present

## 2016-04-09 DIAGNOSIS — F419 Anxiety disorder, unspecified: Secondary | ICD-10-CM | POA: Diagnosis not present

## 2016-04-09 DIAGNOSIS — H16001 Unspecified corneal ulcer, right eye: Secondary | ICD-10-CM | POA: Diagnosis not present

## 2016-04-09 DIAGNOSIS — I429 Cardiomyopathy, unspecified: Secondary | ICD-10-CM | POA: Diagnosis not present

## 2016-04-09 DIAGNOSIS — F0390 Unspecified dementia without behavioral disturbance: Secondary | ICD-10-CM | POA: Diagnosis not present

## 2016-04-09 DIAGNOSIS — I10 Essential (primary) hypertension: Secondary | ICD-10-CM | POA: Diagnosis not present

## 2016-04-09 LAB — AEROBIC CULTURE  (SUPERFICIAL SPECIMEN)

## 2016-04-09 LAB — AEROBIC CULTURE W GRAM STAIN (SUPERFICIAL SPECIMEN)

## 2016-04-10 DIAGNOSIS — I429 Cardiomyopathy, unspecified: Secondary | ICD-10-CM | POA: Diagnosis not present

## 2016-04-10 DIAGNOSIS — H16011 Central corneal ulcer, right eye: Secondary | ICD-10-CM | POA: Diagnosis not present

## 2016-04-10 DIAGNOSIS — F419 Anxiety disorder, unspecified: Secondary | ICD-10-CM | POA: Diagnosis not present

## 2016-04-10 DIAGNOSIS — M1990 Unspecified osteoarthritis, unspecified site: Secondary | ICD-10-CM | POA: Diagnosis not present

## 2016-04-10 DIAGNOSIS — I10 Essential (primary) hypertension: Secondary | ICD-10-CM | POA: Diagnosis not present

## 2016-04-10 DIAGNOSIS — Z9981 Dependence on supplemental oxygen: Secondary | ICD-10-CM | POA: Diagnosis not present

## 2016-04-10 DIAGNOSIS — F0390 Unspecified dementia without behavioral disturbance: Secondary | ICD-10-CM | POA: Diagnosis not present

## 2016-04-13 DIAGNOSIS — H16011 Central corneal ulcer, right eye: Secondary | ICD-10-CM | POA: Diagnosis not present

## 2016-04-13 DIAGNOSIS — Z9981 Dependence on supplemental oxygen: Secondary | ICD-10-CM | POA: Diagnosis not present

## 2016-04-13 DIAGNOSIS — I429 Cardiomyopathy, unspecified: Secondary | ICD-10-CM | POA: Diagnosis not present

## 2016-04-13 DIAGNOSIS — F419 Anxiety disorder, unspecified: Secondary | ICD-10-CM | POA: Diagnosis not present

## 2016-04-13 DIAGNOSIS — M1990 Unspecified osteoarthritis, unspecified site: Secondary | ICD-10-CM | POA: Diagnosis not present

## 2016-04-13 DIAGNOSIS — F0390 Unspecified dementia without behavioral disturbance: Secondary | ICD-10-CM | POA: Diagnosis not present

## 2016-04-13 DIAGNOSIS — I10 Essential (primary) hypertension: Secondary | ICD-10-CM | POA: Diagnosis not present

## 2016-04-14 DIAGNOSIS — F0390 Unspecified dementia without behavioral disturbance: Secondary | ICD-10-CM | POA: Diagnosis not present

## 2016-04-14 DIAGNOSIS — I10 Essential (primary) hypertension: Secondary | ICD-10-CM | POA: Diagnosis not present

## 2016-04-14 DIAGNOSIS — I429 Cardiomyopathy, unspecified: Secondary | ICD-10-CM | POA: Diagnosis not present

## 2016-04-14 DIAGNOSIS — M1990 Unspecified osteoarthritis, unspecified site: Secondary | ICD-10-CM | POA: Diagnosis not present

## 2016-04-14 DIAGNOSIS — F419 Anxiety disorder, unspecified: Secondary | ICD-10-CM | POA: Diagnosis not present

## 2016-04-14 DIAGNOSIS — Z9981 Dependence on supplemental oxygen: Secondary | ICD-10-CM | POA: Diagnosis not present

## 2016-04-15 DIAGNOSIS — F0390 Unspecified dementia without behavioral disturbance: Secondary | ICD-10-CM | POA: Diagnosis not present

## 2016-04-15 DIAGNOSIS — I429 Cardiomyopathy, unspecified: Secondary | ICD-10-CM | POA: Diagnosis not present

## 2016-04-15 DIAGNOSIS — M1990 Unspecified osteoarthritis, unspecified site: Secondary | ICD-10-CM | POA: Diagnosis not present

## 2016-04-15 DIAGNOSIS — F419 Anxiety disorder, unspecified: Secondary | ICD-10-CM | POA: Diagnosis not present

## 2016-04-15 DIAGNOSIS — I10 Essential (primary) hypertension: Secondary | ICD-10-CM | POA: Diagnosis not present

## 2016-04-15 DIAGNOSIS — Z9981 Dependence on supplemental oxygen: Secondary | ICD-10-CM | POA: Diagnosis not present

## 2016-04-16 DIAGNOSIS — I10 Essential (primary) hypertension: Secondary | ICD-10-CM | POA: Diagnosis not present

## 2016-04-16 DIAGNOSIS — F419 Anxiety disorder, unspecified: Secondary | ICD-10-CM | POA: Diagnosis not present

## 2016-04-16 DIAGNOSIS — I429 Cardiomyopathy, unspecified: Secondary | ICD-10-CM | POA: Diagnosis not present

## 2016-04-16 DIAGNOSIS — M1990 Unspecified osteoarthritis, unspecified site: Secondary | ICD-10-CM | POA: Diagnosis not present

## 2016-04-16 DIAGNOSIS — F0390 Unspecified dementia without behavioral disturbance: Secondary | ICD-10-CM | POA: Diagnosis not present

## 2016-04-16 DIAGNOSIS — Z9981 Dependence on supplemental oxygen: Secondary | ICD-10-CM | POA: Diagnosis not present

## 2016-04-17 DIAGNOSIS — F0390 Unspecified dementia without behavioral disturbance: Secondary | ICD-10-CM | POA: Diagnosis not present

## 2016-04-17 DIAGNOSIS — M1990 Unspecified osteoarthritis, unspecified site: Secondary | ICD-10-CM | POA: Diagnosis not present

## 2016-04-17 DIAGNOSIS — F419 Anxiety disorder, unspecified: Secondary | ICD-10-CM | POA: Diagnosis not present

## 2016-04-17 DIAGNOSIS — Z9981 Dependence on supplemental oxygen: Secondary | ICD-10-CM | POA: Diagnosis not present

## 2016-04-17 DIAGNOSIS — I429 Cardiomyopathy, unspecified: Secondary | ICD-10-CM | POA: Diagnosis not present

## 2016-04-17 DIAGNOSIS — I10 Essential (primary) hypertension: Secondary | ICD-10-CM | POA: Diagnosis not present

## 2016-04-20 DIAGNOSIS — M1990 Unspecified osteoarthritis, unspecified site: Secondary | ICD-10-CM | POA: Diagnosis not present

## 2016-04-20 DIAGNOSIS — H16011 Central corneal ulcer, right eye: Secondary | ICD-10-CM | POA: Diagnosis not present

## 2016-04-20 DIAGNOSIS — Z9981 Dependence on supplemental oxygen: Secondary | ICD-10-CM | POA: Diagnosis not present

## 2016-04-20 DIAGNOSIS — F419 Anxiety disorder, unspecified: Secondary | ICD-10-CM | POA: Diagnosis not present

## 2016-04-20 DIAGNOSIS — I10 Essential (primary) hypertension: Secondary | ICD-10-CM | POA: Diagnosis not present

## 2016-04-20 DIAGNOSIS — I429 Cardiomyopathy, unspecified: Secondary | ICD-10-CM | POA: Diagnosis not present

## 2016-04-20 DIAGNOSIS — F0390 Unspecified dementia without behavioral disturbance: Secondary | ICD-10-CM | POA: Diagnosis not present

## 2016-04-21 DIAGNOSIS — Z9981 Dependence on supplemental oxygen: Secondary | ICD-10-CM | POA: Diagnosis not present

## 2016-04-21 DIAGNOSIS — M1990 Unspecified osteoarthritis, unspecified site: Secondary | ICD-10-CM | POA: Diagnosis not present

## 2016-04-21 DIAGNOSIS — F419 Anxiety disorder, unspecified: Secondary | ICD-10-CM | POA: Diagnosis not present

## 2016-04-21 DIAGNOSIS — F0390 Unspecified dementia without behavioral disturbance: Secondary | ICD-10-CM | POA: Diagnosis not present

## 2016-04-21 DIAGNOSIS — I10 Essential (primary) hypertension: Secondary | ICD-10-CM | POA: Diagnosis not present

## 2016-04-21 DIAGNOSIS — I429 Cardiomyopathy, unspecified: Secondary | ICD-10-CM | POA: Diagnosis not present

## 2016-04-22 DIAGNOSIS — I429 Cardiomyopathy, unspecified: Secondary | ICD-10-CM | POA: Diagnosis not present

## 2016-04-22 DIAGNOSIS — Z9981 Dependence on supplemental oxygen: Secondary | ICD-10-CM | POA: Diagnosis not present

## 2016-04-22 DIAGNOSIS — F0390 Unspecified dementia without behavioral disturbance: Secondary | ICD-10-CM | POA: Diagnosis not present

## 2016-04-22 DIAGNOSIS — M1990 Unspecified osteoarthritis, unspecified site: Secondary | ICD-10-CM | POA: Diagnosis not present

## 2016-04-22 DIAGNOSIS — F419 Anxiety disorder, unspecified: Secondary | ICD-10-CM | POA: Diagnosis not present

## 2016-04-22 DIAGNOSIS — I10 Essential (primary) hypertension: Secondary | ICD-10-CM | POA: Diagnosis not present

## 2016-04-23 DIAGNOSIS — Z9981 Dependence on supplemental oxygen: Secondary | ICD-10-CM | POA: Diagnosis not present

## 2016-04-23 DIAGNOSIS — M1990 Unspecified osteoarthritis, unspecified site: Secondary | ICD-10-CM | POA: Diagnosis not present

## 2016-04-23 DIAGNOSIS — I429 Cardiomyopathy, unspecified: Secondary | ICD-10-CM | POA: Diagnosis not present

## 2016-04-23 DIAGNOSIS — F419 Anxiety disorder, unspecified: Secondary | ICD-10-CM | POA: Diagnosis not present

## 2016-04-23 DIAGNOSIS — F0390 Unspecified dementia without behavioral disturbance: Secondary | ICD-10-CM | POA: Diagnosis not present

## 2016-04-23 DIAGNOSIS — I10 Essential (primary) hypertension: Secondary | ICD-10-CM | POA: Diagnosis not present

## 2016-04-24 DIAGNOSIS — I10 Essential (primary) hypertension: Secondary | ICD-10-CM | POA: Diagnosis not present

## 2016-04-24 DIAGNOSIS — Z9981 Dependence on supplemental oxygen: Secondary | ICD-10-CM | POA: Diagnosis not present

## 2016-04-24 DIAGNOSIS — I429 Cardiomyopathy, unspecified: Secondary | ICD-10-CM | POA: Diagnosis not present

## 2016-04-24 DIAGNOSIS — F419 Anxiety disorder, unspecified: Secondary | ICD-10-CM | POA: Diagnosis not present

## 2016-04-24 DIAGNOSIS — M1990 Unspecified osteoarthritis, unspecified site: Secondary | ICD-10-CM | POA: Diagnosis not present

## 2016-04-24 DIAGNOSIS — F0390 Unspecified dementia without behavioral disturbance: Secondary | ICD-10-CM | POA: Diagnosis not present

## 2016-04-27 DIAGNOSIS — I10 Essential (primary) hypertension: Secondary | ICD-10-CM | POA: Diagnosis not present

## 2016-04-27 DIAGNOSIS — I429 Cardiomyopathy, unspecified: Secondary | ICD-10-CM | POA: Diagnosis not present

## 2016-04-27 DIAGNOSIS — M1990 Unspecified osteoarthritis, unspecified site: Secondary | ICD-10-CM | POA: Diagnosis not present

## 2016-04-27 DIAGNOSIS — Z9981 Dependence on supplemental oxygen: Secondary | ICD-10-CM | POA: Diagnosis not present

## 2016-04-27 DIAGNOSIS — F419 Anxiety disorder, unspecified: Secondary | ICD-10-CM | POA: Diagnosis not present

## 2016-04-27 DIAGNOSIS — F0390 Unspecified dementia without behavioral disturbance: Secondary | ICD-10-CM | POA: Diagnosis not present

## 2016-04-28 DIAGNOSIS — M1990 Unspecified osteoarthritis, unspecified site: Secondary | ICD-10-CM | POA: Diagnosis not present

## 2016-04-28 DIAGNOSIS — I429 Cardiomyopathy, unspecified: Secondary | ICD-10-CM | POA: Diagnosis not present

## 2016-04-28 DIAGNOSIS — Z9981 Dependence on supplemental oxygen: Secondary | ICD-10-CM | POA: Diagnosis not present

## 2016-04-28 DIAGNOSIS — I10 Essential (primary) hypertension: Secondary | ICD-10-CM | POA: Diagnosis not present

## 2016-04-28 DIAGNOSIS — F419 Anxiety disorder, unspecified: Secondary | ICD-10-CM | POA: Diagnosis not present

## 2016-04-28 DIAGNOSIS — F0390 Unspecified dementia without behavioral disturbance: Secondary | ICD-10-CM | POA: Diagnosis not present

## 2016-04-29 DIAGNOSIS — Z9981 Dependence on supplemental oxygen: Secondary | ICD-10-CM | POA: Diagnosis not present

## 2016-04-29 DIAGNOSIS — F0390 Unspecified dementia without behavioral disturbance: Secondary | ICD-10-CM | POA: Diagnosis not present

## 2016-04-29 DIAGNOSIS — I10 Essential (primary) hypertension: Secondary | ICD-10-CM | POA: Diagnosis not present

## 2016-04-29 DIAGNOSIS — M1990 Unspecified osteoarthritis, unspecified site: Secondary | ICD-10-CM | POA: Diagnosis not present

## 2016-04-29 DIAGNOSIS — F419 Anxiety disorder, unspecified: Secondary | ICD-10-CM | POA: Diagnosis not present

## 2016-04-29 DIAGNOSIS — H16001 Unspecified corneal ulcer, right eye: Secondary | ICD-10-CM | POA: Diagnosis not present

## 2016-04-29 DIAGNOSIS — I429 Cardiomyopathy, unspecified: Secondary | ICD-10-CM | POA: Diagnosis not present

## 2016-04-30 DIAGNOSIS — I429 Cardiomyopathy, unspecified: Secondary | ICD-10-CM | POA: Diagnosis not present

## 2016-04-30 DIAGNOSIS — F0390 Unspecified dementia without behavioral disturbance: Secondary | ICD-10-CM | POA: Diagnosis not present

## 2016-04-30 DIAGNOSIS — M1990 Unspecified osteoarthritis, unspecified site: Secondary | ICD-10-CM | POA: Diagnosis not present

## 2016-04-30 DIAGNOSIS — Z9981 Dependence on supplemental oxygen: Secondary | ICD-10-CM | POA: Diagnosis not present

## 2016-04-30 DIAGNOSIS — I10 Essential (primary) hypertension: Secondary | ICD-10-CM | POA: Diagnosis not present

## 2016-04-30 DIAGNOSIS — F419 Anxiety disorder, unspecified: Secondary | ICD-10-CM | POA: Diagnosis not present

## 2016-05-01 DIAGNOSIS — M1990 Unspecified osteoarthritis, unspecified site: Secondary | ICD-10-CM | POA: Diagnosis not present

## 2016-05-01 DIAGNOSIS — I10 Essential (primary) hypertension: Secondary | ICD-10-CM | POA: Diagnosis not present

## 2016-05-01 DIAGNOSIS — F0390 Unspecified dementia without behavioral disturbance: Secondary | ICD-10-CM | POA: Diagnosis not present

## 2016-05-01 DIAGNOSIS — F419 Anxiety disorder, unspecified: Secondary | ICD-10-CM | POA: Diagnosis not present

## 2016-05-01 DIAGNOSIS — Z9981 Dependence on supplemental oxygen: Secondary | ICD-10-CM | POA: Diagnosis not present

## 2016-05-01 DIAGNOSIS — I429 Cardiomyopathy, unspecified: Secondary | ICD-10-CM | POA: Diagnosis not present

## 2016-05-04 DIAGNOSIS — I10 Essential (primary) hypertension: Secondary | ICD-10-CM | POA: Diagnosis not present

## 2016-05-04 DIAGNOSIS — Z9981 Dependence on supplemental oxygen: Secondary | ICD-10-CM | POA: Diagnosis not present

## 2016-05-04 DIAGNOSIS — F0390 Unspecified dementia without behavioral disturbance: Secondary | ICD-10-CM | POA: Diagnosis not present

## 2016-05-04 DIAGNOSIS — M1990 Unspecified osteoarthritis, unspecified site: Secondary | ICD-10-CM | POA: Diagnosis not present

## 2016-05-04 DIAGNOSIS — F419 Anxiety disorder, unspecified: Secondary | ICD-10-CM | POA: Diagnosis not present

## 2016-05-04 DIAGNOSIS — I429 Cardiomyopathy, unspecified: Secondary | ICD-10-CM | POA: Diagnosis not present

## 2016-05-05 DIAGNOSIS — M810 Age-related osteoporosis without current pathological fracture: Secondary | ICD-10-CM | POA: Diagnosis not present

## 2016-05-05 DIAGNOSIS — E785 Hyperlipidemia, unspecified: Secondary | ICD-10-CM | POA: Diagnosis not present

## 2016-05-05 DIAGNOSIS — I10 Essential (primary) hypertension: Secondary | ICD-10-CM | POA: Diagnosis not present

## 2016-05-05 DIAGNOSIS — F0390 Unspecified dementia without behavioral disturbance: Secondary | ICD-10-CM | POA: Diagnosis not present

## 2016-05-05 DIAGNOSIS — H16001 Unspecified corneal ulcer, right eye: Secondary | ICD-10-CM | POA: Diagnosis not present

## 2016-05-05 DIAGNOSIS — I872 Venous insufficiency (chronic) (peripheral): Secondary | ICD-10-CM | POA: Diagnosis not present

## 2016-05-05 DIAGNOSIS — B9562 Methicillin resistant Staphylococcus aureus infection as the cause of diseases classified elsewhere: Secondary | ICD-10-CM | POA: Diagnosis not present

## 2016-05-05 DIAGNOSIS — H44001 Unspecified purulent endophthalmitis, right eye: Secondary | ICD-10-CM | POA: Diagnosis not present

## 2016-05-05 DIAGNOSIS — M1990 Unspecified osteoarthritis, unspecified site: Secondary | ICD-10-CM | POA: Diagnosis not present

## 2016-05-05 DIAGNOSIS — I429 Cardiomyopathy, unspecified: Secondary | ICD-10-CM | POA: Diagnosis not present

## 2016-05-05 DIAGNOSIS — Z9981 Dependence on supplemental oxygen: Secondary | ICD-10-CM | POA: Diagnosis not present

## 2016-05-05 DIAGNOSIS — K219 Gastro-esophageal reflux disease without esophagitis: Secondary | ICD-10-CM | POA: Diagnosis not present

## 2016-05-05 DIAGNOSIS — F419 Anxiety disorder, unspecified: Secondary | ICD-10-CM | POA: Diagnosis not present

## 2016-05-05 DIAGNOSIS — E039 Hypothyroidism, unspecified: Secondary | ICD-10-CM | POA: Diagnosis not present

## 2016-05-05 DIAGNOSIS — K589 Irritable bowel syndrome without diarrhea: Secondary | ICD-10-CM | POA: Diagnosis not present

## 2016-05-05 DIAGNOSIS — Z87891 Personal history of nicotine dependence: Secondary | ICD-10-CM | POA: Diagnosis not present

## 2016-05-06 DIAGNOSIS — I429 Cardiomyopathy, unspecified: Secondary | ICD-10-CM | POA: Diagnosis not present

## 2016-05-06 DIAGNOSIS — I10 Essential (primary) hypertension: Secondary | ICD-10-CM | POA: Diagnosis not present

## 2016-05-06 DIAGNOSIS — F0390 Unspecified dementia without behavioral disturbance: Secondary | ICD-10-CM | POA: Diagnosis not present

## 2016-05-06 DIAGNOSIS — F419 Anxiety disorder, unspecified: Secondary | ICD-10-CM | POA: Diagnosis not present

## 2016-05-06 DIAGNOSIS — Z9981 Dependence on supplemental oxygen: Secondary | ICD-10-CM | POA: Diagnosis not present

## 2016-05-06 DIAGNOSIS — M1990 Unspecified osteoarthritis, unspecified site: Secondary | ICD-10-CM | POA: Diagnosis not present

## 2016-05-07 DIAGNOSIS — F0390 Unspecified dementia without behavioral disturbance: Secondary | ICD-10-CM | POA: Diagnosis not present

## 2016-05-07 DIAGNOSIS — M1990 Unspecified osteoarthritis, unspecified site: Secondary | ICD-10-CM | POA: Diagnosis not present

## 2016-05-07 DIAGNOSIS — I429 Cardiomyopathy, unspecified: Secondary | ICD-10-CM | POA: Diagnosis not present

## 2016-05-07 DIAGNOSIS — I10 Essential (primary) hypertension: Secondary | ICD-10-CM | POA: Diagnosis not present

## 2016-05-07 DIAGNOSIS — Z9981 Dependence on supplemental oxygen: Secondary | ICD-10-CM | POA: Diagnosis not present

## 2016-05-07 DIAGNOSIS — F419 Anxiety disorder, unspecified: Secondary | ICD-10-CM | POA: Diagnosis not present

## 2016-05-08 DIAGNOSIS — F0390 Unspecified dementia without behavioral disturbance: Secondary | ICD-10-CM | POA: Diagnosis not present

## 2016-05-08 DIAGNOSIS — Z9981 Dependence on supplemental oxygen: Secondary | ICD-10-CM | POA: Diagnosis not present

## 2016-05-08 DIAGNOSIS — M1990 Unspecified osteoarthritis, unspecified site: Secondary | ICD-10-CM | POA: Diagnosis not present

## 2016-05-08 DIAGNOSIS — I10 Essential (primary) hypertension: Secondary | ICD-10-CM | POA: Diagnosis not present

## 2016-05-08 DIAGNOSIS — F419 Anxiety disorder, unspecified: Secondary | ICD-10-CM | POA: Diagnosis not present

## 2016-05-08 DIAGNOSIS — I429 Cardiomyopathy, unspecified: Secondary | ICD-10-CM | POA: Diagnosis not present

## 2016-05-11 DIAGNOSIS — I429 Cardiomyopathy, unspecified: Secondary | ICD-10-CM | POA: Diagnosis not present

## 2016-05-11 DIAGNOSIS — Z9981 Dependence on supplemental oxygen: Secondary | ICD-10-CM | POA: Diagnosis not present

## 2016-05-11 DIAGNOSIS — M1990 Unspecified osteoarthritis, unspecified site: Secondary | ICD-10-CM | POA: Diagnosis not present

## 2016-05-11 DIAGNOSIS — F0390 Unspecified dementia without behavioral disturbance: Secondary | ICD-10-CM | POA: Diagnosis not present

## 2016-05-11 DIAGNOSIS — I10 Essential (primary) hypertension: Secondary | ICD-10-CM | POA: Diagnosis not present

## 2016-05-11 DIAGNOSIS — F419 Anxiety disorder, unspecified: Secondary | ICD-10-CM | POA: Diagnosis not present

## 2016-05-12 DIAGNOSIS — Z9981 Dependence on supplemental oxygen: Secondary | ICD-10-CM | POA: Diagnosis not present

## 2016-05-12 DIAGNOSIS — M1990 Unspecified osteoarthritis, unspecified site: Secondary | ICD-10-CM | POA: Diagnosis not present

## 2016-05-12 DIAGNOSIS — F419 Anxiety disorder, unspecified: Secondary | ICD-10-CM | POA: Diagnosis not present

## 2016-05-12 DIAGNOSIS — F0390 Unspecified dementia without behavioral disturbance: Secondary | ICD-10-CM | POA: Diagnosis not present

## 2016-05-12 DIAGNOSIS — I10 Essential (primary) hypertension: Secondary | ICD-10-CM | POA: Diagnosis not present

## 2016-05-12 DIAGNOSIS — I429 Cardiomyopathy, unspecified: Secondary | ICD-10-CM | POA: Diagnosis not present

## 2016-05-13 DIAGNOSIS — E039 Hypothyroidism, unspecified: Secondary | ICD-10-CM | POA: Diagnosis not present

## 2016-05-13 DIAGNOSIS — F0150 Vascular dementia without behavioral disturbance: Secondary | ICD-10-CM | POA: Diagnosis not present

## 2016-05-13 DIAGNOSIS — F0390 Unspecified dementia without behavioral disturbance: Secondary | ICD-10-CM | POA: Diagnosis not present

## 2016-05-13 DIAGNOSIS — F39 Unspecified mood [affective] disorder: Secondary | ICD-10-CM | POA: Diagnosis not present

## 2016-05-13 DIAGNOSIS — J9611 Chronic respiratory failure with hypoxia: Secondary | ICD-10-CM | POA: Diagnosis not present

## 2016-05-13 DIAGNOSIS — I5020 Unspecified systolic (congestive) heart failure: Secondary | ICD-10-CM | POA: Diagnosis not present

## 2016-05-13 DIAGNOSIS — Z9981 Dependence on supplemental oxygen: Secondary | ICD-10-CM | POA: Diagnosis not present

## 2016-05-13 DIAGNOSIS — I429 Cardiomyopathy, unspecified: Secondary | ICD-10-CM | POA: Diagnosis not present

## 2016-05-13 DIAGNOSIS — M1990 Unspecified osteoarthritis, unspecified site: Secondary | ICD-10-CM | POA: Diagnosis not present

## 2016-05-13 DIAGNOSIS — K219 Gastro-esophageal reflux disease without esophagitis: Secondary | ICD-10-CM | POA: Diagnosis not present

## 2016-05-13 DIAGNOSIS — F419 Anxiety disorder, unspecified: Secondary | ICD-10-CM | POA: Diagnosis not present

## 2016-05-13 DIAGNOSIS — I10 Essential (primary) hypertension: Secondary | ICD-10-CM | POA: Diagnosis not present

## 2016-05-13 DIAGNOSIS — I270 Primary pulmonary hypertension: Secondary | ICD-10-CM | POA: Diagnosis not present

## 2016-05-13 DIAGNOSIS — I350 Nonrheumatic aortic (valve) stenosis: Secondary | ICD-10-CM | POA: Diagnosis not present

## 2016-05-14 DIAGNOSIS — F0390 Unspecified dementia without behavioral disturbance: Secondary | ICD-10-CM | POA: Diagnosis not present

## 2016-05-14 DIAGNOSIS — Z9981 Dependence on supplemental oxygen: Secondary | ICD-10-CM | POA: Diagnosis not present

## 2016-05-14 DIAGNOSIS — F419 Anxiety disorder, unspecified: Secondary | ICD-10-CM | POA: Diagnosis not present

## 2016-05-14 DIAGNOSIS — I10 Essential (primary) hypertension: Secondary | ICD-10-CM | POA: Diagnosis not present

## 2016-05-14 DIAGNOSIS — M1990 Unspecified osteoarthritis, unspecified site: Secondary | ICD-10-CM | POA: Diagnosis not present

## 2016-05-14 DIAGNOSIS — I429 Cardiomyopathy, unspecified: Secondary | ICD-10-CM | POA: Diagnosis not present

## 2016-05-15 DIAGNOSIS — M1990 Unspecified osteoarthritis, unspecified site: Secondary | ICD-10-CM | POA: Diagnosis not present

## 2016-05-15 DIAGNOSIS — F419 Anxiety disorder, unspecified: Secondary | ICD-10-CM | POA: Diagnosis not present

## 2016-05-15 DIAGNOSIS — F0390 Unspecified dementia without behavioral disturbance: Secondary | ICD-10-CM | POA: Diagnosis not present

## 2016-05-15 DIAGNOSIS — I10 Essential (primary) hypertension: Secondary | ICD-10-CM | POA: Diagnosis not present

## 2016-05-15 DIAGNOSIS — Z9981 Dependence on supplemental oxygen: Secondary | ICD-10-CM | POA: Diagnosis not present

## 2016-05-15 DIAGNOSIS — I429 Cardiomyopathy, unspecified: Secondary | ICD-10-CM | POA: Diagnosis not present

## 2016-05-18 DIAGNOSIS — F0390 Unspecified dementia without behavioral disturbance: Secondary | ICD-10-CM | POA: Diagnosis not present

## 2016-05-18 DIAGNOSIS — F419 Anxiety disorder, unspecified: Secondary | ICD-10-CM | POA: Diagnosis not present

## 2016-05-18 DIAGNOSIS — I10 Essential (primary) hypertension: Secondary | ICD-10-CM | POA: Diagnosis not present

## 2016-05-18 DIAGNOSIS — I429 Cardiomyopathy, unspecified: Secondary | ICD-10-CM | POA: Diagnosis not present

## 2016-05-18 DIAGNOSIS — Z9981 Dependence on supplemental oxygen: Secondary | ICD-10-CM | POA: Diagnosis not present

## 2016-05-18 DIAGNOSIS — M1990 Unspecified osteoarthritis, unspecified site: Secondary | ICD-10-CM | POA: Diagnosis not present

## 2016-05-19 DIAGNOSIS — F0390 Unspecified dementia without behavioral disturbance: Secondary | ICD-10-CM | POA: Diagnosis not present

## 2016-05-19 DIAGNOSIS — Z9981 Dependence on supplemental oxygen: Secondary | ICD-10-CM | POA: Diagnosis not present

## 2016-05-19 DIAGNOSIS — I429 Cardiomyopathy, unspecified: Secondary | ICD-10-CM | POA: Diagnosis not present

## 2016-05-19 DIAGNOSIS — I10 Essential (primary) hypertension: Secondary | ICD-10-CM | POA: Diagnosis not present

## 2016-05-19 DIAGNOSIS — M1990 Unspecified osteoarthritis, unspecified site: Secondary | ICD-10-CM | POA: Diagnosis not present

## 2016-05-19 DIAGNOSIS — F419 Anxiety disorder, unspecified: Secondary | ICD-10-CM | POA: Diagnosis not present

## 2016-05-20 DIAGNOSIS — I429 Cardiomyopathy, unspecified: Secondary | ICD-10-CM | POA: Diagnosis not present

## 2016-05-20 DIAGNOSIS — H16001 Unspecified corneal ulcer, right eye: Secondary | ICD-10-CM | POA: Diagnosis not present

## 2016-05-20 DIAGNOSIS — F419 Anxiety disorder, unspecified: Secondary | ICD-10-CM | POA: Diagnosis not present

## 2016-05-20 DIAGNOSIS — F0390 Unspecified dementia without behavioral disturbance: Secondary | ICD-10-CM | POA: Diagnosis not present

## 2016-05-20 DIAGNOSIS — M1990 Unspecified osteoarthritis, unspecified site: Secondary | ICD-10-CM | POA: Diagnosis not present

## 2016-05-20 DIAGNOSIS — Z9981 Dependence on supplemental oxygen: Secondary | ICD-10-CM | POA: Diagnosis not present

## 2016-05-20 DIAGNOSIS — I10 Essential (primary) hypertension: Secondary | ICD-10-CM | POA: Diagnosis not present

## 2016-05-21 DIAGNOSIS — Z9981 Dependence on supplemental oxygen: Secondary | ICD-10-CM | POA: Diagnosis not present

## 2016-05-21 DIAGNOSIS — F419 Anxiety disorder, unspecified: Secondary | ICD-10-CM | POA: Diagnosis not present

## 2016-05-21 DIAGNOSIS — I429 Cardiomyopathy, unspecified: Secondary | ICD-10-CM | POA: Diagnosis not present

## 2016-05-21 DIAGNOSIS — I10 Essential (primary) hypertension: Secondary | ICD-10-CM | POA: Diagnosis not present

## 2016-05-21 DIAGNOSIS — F0390 Unspecified dementia without behavioral disturbance: Secondary | ICD-10-CM | POA: Diagnosis not present

## 2016-05-21 DIAGNOSIS — M1990 Unspecified osteoarthritis, unspecified site: Secondary | ICD-10-CM | POA: Diagnosis not present

## 2016-05-22 DIAGNOSIS — M1990 Unspecified osteoarthritis, unspecified site: Secondary | ICD-10-CM | POA: Diagnosis not present

## 2016-05-22 DIAGNOSIS — F0390 Unspecified dementia without behavioral disturbance: Secondary | ICD-10-CM | POA: Diagnosis not present

## 2016-05-22 DIAGNOSIS — Z9981 Dependence on supplemental oxygen: Secondary | ICD-10-CM | POA: Diagnosis not present

## 2016-05-22 DIAGNOSIS — I10 Essential (primary) hypertension: Secondary | ICD-10-CM | POA: Diagnosis not present

## 2016-05-22 DIAGNOSIS — F419 Anxiety disorder, unspecified: Secondary | ICD-10-CM | POA: Diagnosis not present

## 2016-05-22 DIAGNOSIS — I429 Cardiomyopathy, unspecified: Secondary | ICD-10-CM | POA: Diagnosis not present

## 2016-05-25 DIAGNOSIS — I429 Cardiomyopathy, unspecified: Secondary | ICD-10-CM | POA: Diagnosis not present

## 2016-05-25 DIAGNOSIS — I10 Essential (primary) hypertension: Secondary | ICD-10-CM | POA: Diagnosis not present

## 2016-05-25 DIAGNOSIS — M1990 Unspecified osteoarthritis, unspecified site: Secondary | ICD-10-CM | POA: Diagnosis not present

## 2016-05-25 DIAGNOSIS — F419 Anxiety disorder, unspecified: Secondary | ICD-10-CM | POA: Diagnosis not present

## 2016-05-25 DIAGNOSIS — F0390 Unspecified dementia without behavioral disturbance: Secondary | ICD-10-CM | POA: Diagnosis not present

## 2016-05-25 DIAGNOSIS — Z9981 Dependence on supplemental oxygen: Secondary | ICD-10-CM | POA: Diagnosis not present

## 2016-05-26 DIAGNOSIS — F0390 Unspecified dementia without behavioral disturbance: Secondary | ICD-10-CM | POA: Diagnosis not present

## 2016-05-26 DIAGNOSIS — I429 Cardiomyopathy, unspecified: Secondary | ICD-10-CM | POA: Diagnosis not present

## 2016-05-26 DIAGNOSIS — Z9981 Dependence on supplemental oxygen: Secondary | ICD-10-CM | POA: Diagnosis not present

## 2016-05-26 DIAGNOSIS — F419 Anxiety disorder, unspecified: Secondary | ICD-10-CM | POA: Diagnosis not present

## 2016-05-26 DIAGNOSIS — M1990 Unspecified osteoarthritis, unspecified site: Secondary | ICD-10-CM | POA: Diagnosis not present

## 2016-05-26 DIAGNOSIS — I10 Essential (primary) hypertension: Secondary | ICD-10-CM | POA: Diagnosis not present

## 2016-05-27 DIAGNOSIS — I429 Cardiomyopathy, unspecified: Secondary | ICD-10-CM | POA: Diagnosis not present

## 2016-05-27 DIAGNOSIS — F419 Anxiety disorder, unspecified: Secondary | ICD-10-CM | POA: Diagnosis not present

## 2016-05-27 DIAGNOSIS — Z9981 Dependence on supplemental oxygen: Secondary | ICD-10-CM | POA: Diagnosis not present

## 2016-05-27 DIAGNOSIS — F0390 Unspecified dementia without behavioral disturbance: Secondary | ICD-10-CM | POA: Diagnosis not present

## 2016-05-27 DIAGNOSIS — I10 Essential (primary) hypertension: Secondary | ICD-10-CM | POA: Diagnosis not present

## 2016-05-27 DIAGNOSIS — M1990 Unspecified osteoarthritis, unspecified site: Secondary | ICD-10-CM | POA: Diagnosis not present

## 2016-05-28 DIAGNOSIS — F0390 Unspecified dementia without behavioral disturbance: Secondary | ICD-10-CM | POA: Diagnosis not present

## 2016-05-28 DIAGNOSIS — I429 Cardiomyopathy, unspecified: Secondary | ICD-10-CM | POA: Diagnosis not present

## 2016-05-28 DIAGNOSIS — Z9981 Dependence on supplemental oxygen: Secondary | ICD-10-CM | POA: Diagnosis not present

## 2016-05-28 DIAGNOSIS — I10 Essential (primary) hypertension: Secondary | ICD-10-CM | POA: Diagnosis not present

## 2016-05-28 DIAGNOSIS — M1990 Unspecified osteoarthritis, unspecified site: Secondary | ICD-10-CM | POA: Diagnosis not present

## 2016-05-28 DIAGNOSIS — F419 Anxiety disorder, unspecified: Secondary | ICD-10-CM | POA: Diagnosis not present

## 2016-05-29 DIAGNOSIS — Z9981 Dependence on supplemental oxygen: Secondary | ICD-10-CM | POA: Diagnosis not present

## 2016-05-29 DIAGNOSIS — I10 Essential (primary) hypertension: Secondary | ICD-10-CM | POA: Diagnosis not present

## 2016-05-29 DIAGNOSIS — M1990 Unspecified osteoarthritis, unspecified site: Secondary | ICD-10-CM | POA: Diagnosis not present

## 2016-05-29 DIAGNOSIS — F419 Anxiety disorder, unspecified: Secondary | ICD-10-CM | POA: Diagnosis not present

## 2016-05-29 DIAGNOSIS — F0390 Unspecified dementia without behavioral disturbance: Secondary | ICD-10-CM | POA: Diagnosis not present

## 2016-05-29 DIAGNOSIS — I429 Cardiomyopathy, unspecified: Secondary | ICD-10-CM | POA: Diagnosis not present

## 2016-06-01 DIAGNOSIS — M1990 Unspecified osteoarthritis, unspecified site: Secondary | ICD-10-CM | POA: Diagnosis not present

## 2016-06-01 DIAGNOSIS — Z9981 Dependence on supplemental oxygen: Secondary | ICD-10-CM | POA: Diagnosis not present

## 2016-06-01 DIAGNOSIS — F0390 Unspecified dementia without behavioral disturbance: Secondary | ICD-10-CM | POA: Diagnosis not present

## 2016-06-01 DIAGNOSIS — F419 Anxiety disorder, unspecified: Secondary | ICD-10-CM | POA: Diagnosis not present

## 2016-06-01 DIAGNOSIS — I429 Cardiomyopathy, unspecified: Secondary | ICD-10-CM | POA: Diagnosis not present

## 2016-06-01 DIAGNOSIS — I10 Essential (primary) hypertension: Secondary | ICD-10-CM | POA: Diagnosis not present

## 2016-06-02 DIAGNOSIS — I10 Essential (primary) hypertension: Secondary | ICD-10-CM | POA: Diagnosis not present

## 2016-06-02 DIAGNOSIS — M1990 Unspecified osteoarthritis, unspecified site: Secondary | ICD-10-CM | POA: Diagnosis not present

## 2016-06-02 DIAGNOSIS — F0390 Unspecified dementia without behavioral disturbance: Secondary | ICD-10-CM | POA: Diagnosis not present

## 2016-06-02 DIAGNOSIS — F419 Anxiety disorder, unspecified: Secondary | ICD-10-CM | POA: Diagnosis not present

## 2016-06-02 DIAGNOSIS — I429 Cardiomyopathy, unspecified: Secondary | ICD-10-CM | POA: Diagnosis not present

## 2016-06-02 DIAGNOSIS — Z9981 Dependence on supplemental oxygen: Secondary | ICD-10-CM | POA: Diagnosis not present

## 2016-06-03 DIAGNOSIS — I429 Cardiomyopathy, unspecified: Secondary | ICD-10-CM | POA: Diagnosis not present

## 2016-06-03 DIAGNOSIS — Z9981 Dependence on supplemental oxygen: Secondary | ICD-10-CM | POA: Diagnosis not present

## 2016-06-03 DIAGNOSIS — F0390 Unspecified dementia without behavioral disturbance: Secondary | ICD-10-CM | POA: Diagnosis not present

## 2016-06-03 DIAGNOSIS — I10 Essential (primary) hypertension: Secondary | ICD-10-CM | POA: Diagnosis not present

## 2016-06-03 DIAGNOSIS — M1990 Unspecified osteoarthritis, unspecified site: Secondary | ICD-10-CM | POA: Diagnosis not present

## 2016-06-03 DIAGNOSIS — F419 Anxiety disorder, unspecified: Secondary | ICD-10-CM | POA: Diagnosis not present

## 2016-06-04 DIAGNOSIS — Z9981 Dependence on supplemental oxygen: Secondary | ICD-10-CM | POA: Diagnosis not present

## 2016-06-04 DIAGNOSIS — M1990 Unspecified osteoarthritis, unspecified site: Secondary | ICD-10-CM | POA: Diagnosis not present

## 2016-06-04 DIAGNOSIS — I10 Essential (primary) hypertension: Secondary | ICD-10-CM | POA: Diagnosis not present

## 2016-06-04 DIAGNOSIS — F0390 Unspecified dementia without behavioral disturbance: Secondary | ICD-10-CM | POA: Diagnosis not present

## 2016-06-04 DIAGNOSIS — I429 Cardiomyopathy, unspecified: Secondary | ICD-10-CM | POA: Diagnosis not present

## 2016-06-04 DIAGNOSIS — F419 Anxiety disorder, unspecified: Secondary | ICD-10-CM | POA: Diagnosis not present

## 2016-06-05 DIAGNOSIS — I429 Cardiomyopathy, unspecified: Secondary | ICD-10-CM | POA: Diagnosis not present

## 2016-06-05 DIAGNOSIS — E785 Hyperlipidemia, unspecified: Secondary | ICD-10-CM | POA: Diagnosis not present

## 2016-06-05 DIAGNOSIS — B9562 Methicillin resistant Staphylococcus aureus infection as the cause of diseases classified elsewhere: Secondary | ICD-10-CM | POA: Diagnosis not present

## 2016-06-05 DIAGNOSIS — F0390 Unspecified dementia without behavioral disturbance: Secondary | ICD-10-CM | POA: Diagnosis not present

## 2016-06-05 DIAGNOSIS — K219 Gastro-esophageal reflux disease without esophagitis: Secondary | ICD-10-CM | POA: Diagnosis not present

## 2016-06-05 DIAGNOSIS — E039 Hypothyroidism, unspecified: Secondary | ICD-10-CM | POA: Diagnosis not present

## 2016-06-05 DIAGNOSIS — K589 Irritable bowel syndrome without diarrhea: Secondary | ICD-10-CM | POA: Diagnosis not present

## 2016-06-05 DIAGNOSIS — F419 Anxiety disorder, unspecified: Secondary | ICD-10-CM | POA: Diagnosis not present

## 2016-06-05 DIAGNOSIS — I10 Essential (primary) hypertension: Secondary | ICD-10-CM | POA: Diagnosis not present

## 2016-06-05 DIAGNOSIS — Z87891 Personal history of nicotine dependence: Secondary | ICD-10-CM | POA: Diagnosis not present

## 2016-06-05 DIAGNOSIS — M1990 Unspecified osteoarthritis, unspecified site: Secondary | ICD-10-CM | POA: Diagnosis not present

## 2016-06-05 DIAGNOSIS — Z9981 Dependence on supplemental oxygen: Secondary | ICD-10-CM | POA: Diagnosis not present

## 2016-06-05 DIAGNOSIS — M810 Age-related osteoporosis without current pathological fracture: Secondary | ICD-10-CM | POA: Diagnosis not present

## 2016-06-05 DIAGNOSIS — I872 Venous insufficiency (chronic) (peripheral): Secondary | ICD-10-CM | POA: Diagnosis not present

## 2016-06-05 DIAGNOSIS — H44001 Unspecified purulent endophthalmitis, right eye: Secondary | ICD-10-CM | POA: Diagnosis not present

## 2016-06-08 DIAGNOSIS — Z9981 Dependence on supplemental oxygen: Secondary | ICD-10-CM | POA: Diagnosis not present

## 2016-06-08 DIAGNOSIS — F0390 Unspecified dementia without behavioral disturbance: Secondary | ICD-10-CM | POA: Diagnosis not present

## 2016-06-08 DIAGNOSIS — I429 Cardiomyopathy, unspecified: Secondary | ICD-10-CM | POA: Diagnosis not present

## 2016-06-08 DIAGNOSIS — I10 Essential (primary) hypertension: Secondary | ICD-10-CM | POA: Diagnosis not present

## 2016-06-08 DIAGNOSIS — M1990 Unspecified osteoarthritis, unspecified site: Secondary | ICD-10-CM | POA: Diagnosis not present

## 2016-06-08 DIAGNOSIS — F419 Anxiety disorder, unspecified: Secondary | ICD-10-CM | POA: Diagnosis not present

## 2016-06-09 DIAGNOSIS — F419 Anxiety disorder, unspecified: Secondary | ICD-10-CM | POA: Diagnosis not present

## 2016-06-09 DIAGNOSIS — Z9981 Dependence on supplemental oxygen: Secondary | ICD-10-CM | POA: Diagnosis not present

## 2016-06-09 DIAGNOSIS — F0390 Unspecified dementia without behavioral disturbance: Secondary | ICD-10-CM | POA: Diagnosis not present

## 2016-06-09 DIAGNOSIS — I10 Essential (primary) hypertension: Secondary | ICD-10-CM | POA: Diagnosis not present

## 2016-06-09 DIAGNOSIS — I429 Cardiomyopathy, unspecified: Secondary | ICD-10-CM | POA: Diagnosis not present

## 2016-06-09 DIAGNOSIS — M1990 Unspecified osteoarthritis, unspecified site: Secondary | ICD-10-CM | POA: Diagnosis not present

## 2016-07-05 DEATH — deceased
